# Patient Record
Sex: Female | Born: 1970 | Race: Black or African American | Hispanic: No | Marital: Married
Health system: Southern US, Community
[De-identification: ages and names within clinical notes are randomized; demographics above are authoritative.]

## PROBLEM LIST (undated history)

## (undated) DIAGNOSIS — R0789 Other chest pain: Secondary | ICD-10-CM

## (undated) DIAGNOSIS — M109 Gout, unspecified: Secondary | ICD-10-CM

## (undated) DIAGNOSIS — M545 Low back pain, unspecified: Secondary | ICD-10-CM

## (undated) DIAGNOSIS — I161 Hypertensive emergency: Secondary | ICD-10-CM

## (undated) DIAGNOSIS — E119 Type 2 diabetes mellitus without complications: Secondary | ICD-10-CM

## (undated) DIAGNOSIS — I10 Essential (primary) hypertension: Secondary | ICD-10-CM

## (undated) DIAGNOSIS — J45909 Unspecified asthma, uncomplicated: Secondary | ICD-10-CM

## (undated) DIAGNOSIS — R32 Unspecified urinary incontinence: Secondary | ICD-10-CM

## (undated) DIAGNOSIS — C801 Malignant (primary) neoplasm, unspecified: Secondary | ICD-10-CM

## (undated) DIAGNOSIS — R809 Proteinuria, unspecified: Secondary | ICD-10-CM

## (undated) DIAGNOSIS — G8929 Other chronic pain: Secondary | ICD-10-CM

## (undated) DIAGNOSIS — F341 Dysthymic disorder: Secondary | ICD-10-CM

## (undated) DIAGNOSIS — E785 Hyperlipidemia, unspecified: Secondary | ICD-10-CM

## (undated) DIAGNOSIS — F5103 Paradoxical insomnia: Secondary | ICD-10-CM

## (undated) DIAGNOSIS — F32A Depression, unspecified: Secondary | ICD-10-CM

## (undated) HISTORY — PX: COLONOSCOPY W/ BIOPSIES: SHX1374

## (undated) HISTORY — PX: ESOPHAGOGASTRODUODENOSCOPY: SHX1529

---

## 2006-09-18 ENCOUNTER — Ambulatory Visit: Payer: Self-pay

## 2013-08-05 ENCOUNTER — Encounter: Payer: Self-pay | Admitting: Family Medicine

## 2013-08-26 ENCOUNTER — Encounter: Payer: Self-pay | Admitting: Family Medicine

## 2020-04-04 ENCOUNTER — Ambulatory Visit: Payer: Self-pay | Attending: Internal Medicine

## 2020-04-04 DIAGNOSIS — Z23 Encounter for immunization: Secondary | ICD-10-CM

## 2020-04-04 NOTE — Progress Notes (Signed)
   Covid-19 Vaccination Clinic  Name:  Rebecca Campbell    MRN: [id number] DOB: 15-Apr-1971  04/04/2020  [name] was observed post Covid-19 immunization for 15 minutes without incident. She was provided with Vaccine Information Sheet and instruction to access the V-Safe system.   [name] was instructed to call 911 with any severe reactions post vaccine: Marland Kitchen Difficulty breathing  . Swelling of face and throat  . A fast heartbeat  . A bad rash all over body  . Dizziness and weakness   Immunizations Administered    Name Date Dose VIS Date Route   Pfizer COVID-19 Vaccine 04/04/2020  8:14 AM 0.3 mL 12/06/2019 Intramuscular   Manufacturer: [place]   Lot: [id number]   [place]: [id number]

## 2020-05-02 ENCOUNTER — Ambulatory Visit: Payer: Self-pay | Attending: Internal Medicine

## 2020-05-02 DIAGNOSIS — Z23 Encounter for immunization: Secondary | ICD-10-CM

## 2020-05-02 NOTE — Progress Notes (Signed)
   Covid-19 Vaccination Clinic  Name:  HALLIE ERTL    MRN: [id number] DOB: September 05, 1971  05/02/2020  Ms. Anstey was observed post Covid-19 immunization for 15 minutes without incident. She was provided with Vaccine Information Sheet and instruction to access the V-Safe system.   Ms. Haynes was instructed to call 911 with any severe reactions post vaccine: Marland Kitchen Difficulty breathing  . Swelling of face and throat  . A fast heartbeat  . A bad rash all over body  . Dizziness and weakness   Immunizations Administered    Name Date Dose VIS Date Route   Pfizer COVID-19 Vaccine 05/02/2020  8:23 AM 0.3 mL 02/19/2019 Intramuscular   Manufacturer: [employer]   Lot: [id number]   [place]: [id number]

## 2021-09-12 ENCOUNTER — Encounter: Payer: Self-pay | Admitting: *Deleted

## 2021-09-12 ENCOUNTER — Other Ambulatory Visit: Payer: Self-pay

## 2021-09-12 ENCOUNTER — Inpatient Hospital Stay
Admission: EM | Admit: 2021-09-12 | Discharge: 2021-09-18 | DRG: 291 | Disposition: A | Discharge status: Home Health | Payer: Medicare HMO | Attending: Pulmonary Disease | Admitting: Pulmonary Disease

## 2021-09-12 ENCOUNTER — Emergency Department: Payer: Medicare HMO

## 2021-09-12 DIAGNOSIS — I5033 Acute on chronic diastolic (congestive) heart failure: Secondary | ICD-10-CM

## 2021-09-12 DIAGNOSIS — N179 Acute kidney failure, unspecified: Secondary | ICD-10-CM | POA: Diagnosis present

## 2021-09-12 DIAGNOSIS — Z8616 Personal history of COVID-19: Secondary | ICD-10-CM | POA: Diagnosis not present

## 2021-09-12 DIAGNOSIS — K760 Fatty (change of) liver, not elsewhere classified: Secondary | ICD-10-CM | POA: Diagnosis present

## 2021-09-12 DIAGNOSIS — C3490 Malignant neoplasm of unspecified part of unspecified bronchus or lung: Secondary | ICD-10-CM | POA: Diagnosis present

## 2021-09-12 DIAGNOSIS — J45909 Unspecified asthma, uncomplicated: Secondary | ICD-10-CM | POA: Diagnosis present

## 2021-09-12 DIAGNOSIS — E1122 Type 2 diabetes mellitus with diabetic chronic kidney disease: Secondary | ICD-10-CM | POA: Diagnosis present

## 2021-09-12 DIAGNOSIS — I248 Other forms of acute ischemic heart disease: Secondary | ICD-10-CM | POA: Diagnosis present

## 2021-09-12 DIAGNOSIS — J9600 Acute respiratory failure, unspecified whether with hypoxia or hypercapnia: Secondary | ICD-10-CM | POA: Diagnosis present

## 2021-09-12 DIAGNOSIS — J9602 Acute respiratory failure with hypercapnia: Secondary | ICD-10-CM | POA: Diagnosis not present

## 2021-09-12 DIAGNOSIS — D6959 Other secondary thrombocytopenia: Secondary | ICD-10-CM | POA: Diagnosis present

## 2021-09-12 DIAGNOSIS — F419 Anxiety disorder, unspecified: Secondary | ICD-10-CM

## 2021-09-12 DIAGNOSIS — Z85038 Personal history of other malignant neoplasm of large intestine: Secondary | ICD-10-CM

## 2021-09-12 DIAGNOSIS — C7800 Secondary malignant neoplasm of unspecified lung: Secondary | ICD-10-CM | POA: Diagnosis present

## 2021-09-12 DIAGNOSIS — F341 Dysthymic disorder: Secondary | ICD-10-CM | POA: Diagnosis present

## 2021-09-12 DIAGNOSIS — R0603 Acute respiratory distress: Secondary | ICD-10-CM

## 2021-09-12 DIAGNOSIS — J9622 Acute and chronic respiratory failure with hypercapnia: Secondary | ICD-10-CM | POA: Diagnosis present

## 2021-09-12 DIAGNOSIS — J189 Pneumonia, unspecified organism: Secondary | ICD-10-CM | POA: Diagnosis not present

## 2021-09-12 DIAGNOSIS — F4310 Post-traumatic stress disorder, unspecified: Secondary | ICD-10-CM | POA: Diagnosis present

## 2021-09-12 DIAGNOSIS — Z6841 Body Mass Index (BMI) 40.0 and over, adult: Secondary | ICD-10-CM | POA: Diagnosis not present

## 2021-09-12 DIAGNOSIS — T451X5A Adverse effect of antineoplastic and immunosuppressive drugs, initial encounter: Secondary | ICD-10-CM | POA: Diagnosis present

## 2021-09-12 DIAGNOSIS — I160 Hypertensive urgency: Secondary | ICD-10-CM | POA: Diagnosis present

## 2021-09-12 DIAGNOSIS — J9601 Acute respiratory failure with hypoxia: Secondary | ICD-10-CM | POA: Diagnosis not present

## 2021-09-12 DIAGNOSIS — G4700 Insomnia, unspecified: Secondary | ICD-10-CM | POA: Diagnosis present

## 2021-09-12 DIAGNOSIS — I130 Hypertensive heart and chronic kidney disease with heart failure and stage 1 through stage 4 chronic kidney disease, or unspecified chronic kidney disease: Secondary | ICD-10-CM | POA: Diagnosis present

## 2021-09-12 DIAGNOSIS — Z882 Allergy status to sulfonamides status: Secondary | ICD-10-CM

## 2021-09-12 DIAGNOSIS — I161 Hypertensive emergency: Secondary | ICD-10-CM | POA: Diagnosis present

## 2021-09-12 DIAGNOSIS — N189 Chronic kidney disease, unspecified: Secondary | ICD-10-CM

## 2021-09-12 DIAGNOSIS — T502X5A Adverse effect of carbonic-anhydrase inhibitors, benzothiadiazides and other diuretics, initial encounter: Secondary | ICD-10-CM | POA: Diagnosis present

## 2021-09-12 DIAGNOSIS — N184 Chronic kidney disease, stage 4 (severe): Secondary | ICD-10-CM | POA: Diagnosis present

## 2021-09-12 DIAGNOSIS — J810 Acute pulmonary edema: Secondary | ICD-10-CM

## 2021-09-12 DIAGNOSIS — E785 Hyperlipidemia, unspecified: Secondary | ICD-10-CM | POA: Diagnosis present

## 2021-09-12 DIAGNOSIS — F32A Depression, unspecified: Secondary | ICD-10-CM

## 2021-09-12 DIAGNOSIS — Z888 Allergy status to other drugs, medicaments and biological substances status: Secondary | ICD-10-CM

## 2021-09-12 DIAGNOSIS — J9621 Acute and chronic respiratory failure with hypoxia: Secondary | ICD-10-CM | POA: Diagnosis present

## 2021-09-12 DIAGNOSIS — D6481 Anemia due to antineoplastic chemotherapy: Secondary | ICD-10-CM | POA: Diagnosis present

## 2021-09-12 DIAGNOSIS — Z881 Allergy status to other antibiotic agents status: Secondary | ICD-10-CM

## 2021-09-12 DIAGNOSIS — C19 Malignant neoplasm of rectosigmoid junction: Secondary | ICD-10-CM | POA: Diagnosis present

## 2021-09-12 DIAGNOSIS — G8929 Other chronic pain: Secondary | ICD-10-CM

## 2021-09-12 DIAGNOSIS — M109 Gout, unspecified: Secondary | ICD-10-CM | POA: Diagnosis present

## 2021-09-12 DIAGNOSIS — J984 Other disorders of lung: Secondary | ICD-10-CM | POA: Diagnosis present

## 2021-09-12 DIAGNOSIS — E662 Morbid (severe) obesity with alveolar hypoventilation: Secondary | ICD-10-CM | POA: Diagnosis present

## 2021-09-12 DIAGNOSIS — E870 Hyperosmolality and hypernatremia: Secondary | ICD-10-CM | POA: Diagnosis present

## 2021-09-12 DIAGNOSIS — Z794 Long term (current) use of insulin: Secondary | ICD-10-CM

## 2021-09-12 DIAGNOSIS — J9620 Acute and chronic respiratory failure, unspecified whether with hypoxia or hypercapnia: Secondary | ICD-10-CM

## 2021-09-12 DIAGNOSIS — E1169 Type 2 diabetes mellitus with other specified complication: Secondary | ICD-10-CM

## 2021-09-12 DIAGNOSIS — R32 Unspecified urinary incontinence: Secondary | ICD-10-CM | POA: Diagnosis present

## 2021-09-12 DIAGNOSIS — I2510 Atherosclerotic heart disease of native coronary artery without angina pectoris: Secondary | ICD-10-CM | POA: Diagnosis present

## 2021-09-12 HISTORY — DX: Other chronic pain: G89.29

## 2021-09-12 HISTORY — DX: Unspecified asthma, uncomplicated: J45.909

## 2021-09-12 HISTORY — DX: Other chest pain: R07.89

## 2021-09-12 HISTORY — DX: Hypertensive emergency: I16.1

## 2021-09-12 HISTORY — DX: Proteinuria, unspecified: R80.9

## 2021-09-12 HISTORY — DX: Depression, unspecified: F32.A

## 2021-09-12 HISTORY — DX: Paradoxical insomnia: F51.03

## 2021-09-12 HISTORY — DX: Hyperlipidemia, unspecified: E78.5

## 2021-09-12 HISTORY — DX: Morbid (severe) obesity due to excess calories: E66.01

## 2021-09-12 HISTORY — DX: Malignant (primary) neoplasm, unspecified: C80.1

## 2021-09-12 HISTORY — DX: Type 2 diabetes mellitus without complications: E11.9

## 2021-09-12 HISTORY — DX: Low back pain, unspecified: M54.50

## 2021-09-12 HISTORY — DX: Gout, unspecified: M10.9

## 2021-09-12 HISTORY — DX: Dysthymic disorder: F34.1

## 2021-09-12 HISTORY — DX: Unspecified urinary incontinence: R32

## 2021-09-12 HISTORY — DX: Essential (primary) hypertension: I10

## 2021-09-12 LAB — CBC WITH DIFFERENTIAL/PLATELET
Abs Immature Granulocytes: 0.05 10*3/uL (ref 0.00–0.07)
Basophils Absolute: 0 10*3/uL (ref 0.0–0.1)
Basophils Relative: 0 %
Eosinophils Absolute: 0.1 10*3/uL (ref 0.0–0.5)
Eosinophils Relative: 1 %
HCT: 33 % — ABNORMAL LOW (ref 36.0–46.0)
Hemoglobin: 10.8 g/dL — ABNORMAL LOW (ref 12.0–15.0)
Immature Granulocytes: 0 %
Lymphocytes Relative: 20 %
Lymphs Abs: 3 10*3/uL (ref 0.7–4.0)
MCH: 30.6 pg (ref 26.0–34.0)
MCHC: 32.7 g/dL (ref 30.0–36.0)
MCV: 93.5 fL (ref 80.0–100.0)
Monocytes Absolute: 0.5 10*3/uL (ref 0.1–1.0)
Monocytes Relative: 3 %
Neutro Abs: 11 10*3/uL — ABNORMAL HIGH (ref 1.7–7.7)
Neutrophils Relative %: 76 %
Platelets: 174 10*3/uL (ref 150–400)
RBC: 3.53 MIL/uL — ABNORMAL LOW (ref 3.87–5.11)
RDW: 15.9 % — ABNORMAL HIGH (ref 11.5–15.5)
WBC: 14.6 10*3/uL — ABNORMAL HIGH (ref 4.0–10.5)
nRBC: 0.1 % (ref 0.0–0.2)

## 2021-09-12 LAB — COMPREHENSIVE METABOLIC PANEL
ALT: 18 U/L (ref 0–44)
AST: 19 U/L (ref 15–41)
Albumin: 3.5 g/dL (ref 3.5–5.0)
Alkaline Phosphatase: 84 U/L (ref 38–126)
Anion gap: 6 (ref 5–15)
BUN: 46 mg/dL — ABNORMAL HIGH (ref 6–20)
CO2: 29 mmol/L (ref 22–32)
Calcium: 8.8 mg/dL — ABNORMAL LOW (ref 8.9–10.3)
Chloride: 109 mmol/L (ref 98–111)
Creatinine, Ser: 2.35 mg/dL — ABNORMAL HIGH (ref 0.44–1.00)
GFR, Estimated: 24 mL/min — ABNORMAL LOW (ref >60)
Glucose, Bld: 245 mg/dL — ABNORMAL HIGH (ref 70–99)
Potassium: 4 mmol/L (ref 3.5–5.1)
Sodium: 144 mmol/L (ref 135–145)
Total Bilirubin: 0.8 mg/dL (ref 0.3–1.2)
Total Protein: 7.2 g/dL (ref 6.5–8.1)

## 2021-09-12 LAB — RESP PANEL BY RT-PCR (FLU A&B, COVID) ARPGX2
Influenza A by PCR: NEGATIVE
Influenza B by PCR: NEGATIVE
SARS Coronavirus 2 by RT PCR: NEGATIVE

## 2021-09-12 LAB — URINALYSIS, COMPLETE (UACMP) WITH MICROSCOPIC
Bacteria, UA: NONE SEEN
Bilirubin Urine: NEGATIVE
Glucose, UA: 500 mg/dL — AB
Ketones, ur: NEGATIVE mg/dL
Leukocytes,Ua: NEGATIVE
Nitrite: NEGATIVE
Protein, ur: >300 mg/dL — AB
Specific Gravity, Urine: 1.02 (ref 1.005–1.030)
pH: 5.5 (ref 5.0–8.0)

## 2021-09-12 LAB — BLOOD GAS, ARTERIAL
Acid-base deficit: 0.5 mmol/L (ref 0.0–2.0)
Bicarbonate: 26.8 mmol/L (ref 20.0–28.0)
Expiratory PAP: 8
FIO2: 100
Inspiratory PAP: 16
O2 Saturation: 90.7 %
Patient temperature: 37
pCO2 arterial: 57 mmHg — ABNORMAL HIGH (ref 32.0–48.0)
pH, Arterial: 7.28 — ABNORMAL LOW (ref 7.350–7.450)
pO2, Arterial: 68 mmHg — ABNORMAL LOW (ref 83.0–108.0)

## 2021-09-12 LAB — PROTIME-INR
INR: 1.1 (ref 0.8–1.2)
Prothrombin Time: 14.3 seconds (ref 11.4–15.2)

## 2021-09-12 LAB — LACTIC ACID, PLASMA: Lactic Acid, Venous: 1.6 mmol/L (ref 0.5–1.9)

## 2021-09-12 LAB — APTT: aPTT: 27 seconds (ref 24–36)

## 2021-09-12 LAB — PROCALCITONIN: Procalcitonin: <0.1 ng/mL

## 2021-09-12 LAB — BRAIN NATRIURETIC PEPTIDE: B Natriuretic Peptide: 354.6 pg/mL — ABNORMAL HIGH (ref 0.0–100.0)

## 2021-09-12 LAB — TROPONIN I (HIGH SENSITIVITY): Troponin I (High Sensitivity): 40 ng/L — ABNORMAL HIGH (ref <18)

## 2021-09-12 MED ORDER — PROPOFOL 1000 MG/100ML IV EMUL
INTRAVENOUS | Status: AC
  Filled 2021-09-12: qty 100

## 2021-09-12 MED ORDER — ROCURONIUM BROMIDE 10 MG/ML (PF) SYRINGE
PREFILLED_SYRINGE | INTRAVENOUS | Status: AC
  Filled 2021-09-12: qty 10

## 2021-09-12 MED ORDER — HYDRALAZINE HCL 20 MG/ML IJ SOLN
10.0000 mg | INTRAMUSCULAR | Status: DC | PRN
  Administered 2021-09-13 (×3): 20 mg via INTRAVENOUS
  Filled 2021-09-12 (×3): qty 1

## 2021-09-12 MED ORDER — HEPARIN SODIUM (PORCINE) 5000 UNIT/ML IJ SOLN
5000.0000 [IU] | Freq: Three times a day (TID) | INTRAMUSCULAR | Status: DC
  Administered 2021-09-12 – 2021-09-18 (×17): 5000 [IU] via SUBCUTANEOUS
  Filled 2021-09-12 (×17): qty 1

## 2021-09-12 MED ORDER — IPRATROPIUM-ALBUTEROL 0.5-2.5 (3) MG/3ML IN SOLN
3.0000 mL | RESPIRATORY_TRACT | Status: DC | PRN
  Administered 2021-09-17: 3 mL via RESPIRATORY_TRACT
  Filled 2021-09-12: qty 3

## 2021-09-12 MED ORDER — METHYLPREDNISOLONE SODIUM SUCC 125 MG IJ SOLR
125.0000 mg | Freq: Once | INTRAMUSCULAR | Status: AC
  Administered 2021-09-12: 125 mg via INTRAVENOUS
  Filled 2021-09-12: qty 2

## 2021-09-12 MED ORDER — NITROGLYCERIN IN D5W 200-5 MCG/ML-% IV SOLN
INTRAVENOUS | Status: AC
  Administered 2021-09-12: 50 ug/min via INTRAVENOUS
  Filled 2021-09-12: qty 250

## 2021-09-12 MED ORDER — POLYETHYLENE GLYCOL 3350 17 G PO PACK: 17.0000 g | PACK | Freq: Every day | ORAL | Status: DC | PRN

## 2021-09-12 MED ORDER — DOCUSATE SODIUM 100 MG PO CAPS: 100.0000 mg | ORAL_CAPSULE | Freq: Two times a day (BID) | ORAL | Status: DC | PRN

## 2021-09-12 MED ORDER — FUROSEMIDE 10 MG/ML IJ SOLN
80.0000 mg | Freq: Once | INTRAMUSCULAR | Status: AC
  Administered 2021-09-12: 80 mg via INTRAVENOUS
  Filled 2021-09-12: qty 8

## 2021-09-12 MED ORDER — IPRATROPIUM-ALBUTEROL 0.5-2.5 (3) MG/3ML IN SOLN: 3.0000 mL | Freq: Once | RESPIRATORY_TRACT | Status: DC

## 2021-09-12 MED ORDER — NITROGLYCERIN IN D5W 200-5 MCG/ML-% IV SOLN: 0.0000 ug/min | INTRAVENOUS | Status: DC

## 2021-09-12 MED ORDER — KETAMINE HCL 50 MG/5ML IJ SOSY
PREFILLED_SYRINGE | INTRAMUSCULAR | Status: AC
  Filled 2021-09-12: qty 5

## 2021-09-12 MED ORDER — SODIUM CHLORIDE 0.9 % IV SOLN: 500.0000 mg | INTRAVENOUS | Status: DC

## 2021-09-12 MED ORDER — MORPHINE SULFATE (PF) 4 MG/ML IV SOLN
4.0000 mg | Freq: Once | INTRAVENOUS | Status: DC
  Administered 2021-09-12: 4 mg via INTRAVENOUS
  Filled 2021-09-12: qty 1

## 2021-09-12 MED ORDER — INSULIN ASPART 100 UNIT/ML IJ SOLN
0.0000 [IU] | INTRAMUSCULAR | Status: DC
Start: 2021-09-13 — End: 2021-09-13
  Administered 2021-09-13 (×2): 5 [IU] via SUBCUTANEOUS
  Administered 2021-09-13 (×2): 3 [IU] via SUBCUTANEOUS
  Filled 2021-09-12 (×4): qty 1

## 2021-09-12 MED ORDER — HYDROMORPHONE HCL 1 MG/ML IJ SOLN
0.5000 mg | Freq: Once | INTRAMUSCULAR | Status: AC
  Administered 2021-09-12: 0.5 mg via INTRAVENOUS
  Filled 2021-09-12: qty 1

## 2021-09-12 NOTE — ED Triage Notes (Signed)
Pt brought in by ems for c/o respiratory distress; pt sats in the 40's at home; pt unable to tolerate bipap with ems due to anxiety; pt arrived unable to verbalize complaint; pt placed on bipap and pt is now alert and able to talk

## 2021-09-12 NOTE — ED Notes (Signed)
Pt c/o being wet; purewick catheter applied and chuck pad placed under pt; husband at bedside and states he is not happy because she is here; explained that ems has to take pt to nearest facility based on pt's acuity; Dr. Tamala Julian informed of spouses' need to speak with him

## 2021-09-12 NOTE — Progress Notes (Signed)
PHARMACY CONSULT NOTE - FOLLOW UP  Pharmacy Consult for Electrolyte Monitoring and Replacement   Recent Labs: Potassium (mmol/L)  Date Value  09/12/2021 4.0   Calcium (mg/dL)  Date Value  09/12/2021 8.8 (L)   Albumin (g/dL)  Date Value  09/12/2021 3.5   Sodium (mmol/L)  Date Value  09/12/2021 144     Assessment: 9/18:  Ca = 8.8,   Albumin = 3.5,  Corrected Ca = 9.2  Goal of Therapy:  Electrolytes WNL   Plan:  No additional electrolytes needed at this time.  Will recheck electrolytes on 9/19 with AM labs.   Orene Desanctis ,PharmD Clinical Pharmacist 09/12/2021 11:05 PM

## 2021-09-12 NOTE — ED Provider Notes (Signed)
[facility] Emergency Department Provider Note ____________________________________________   Event Date/Time   First MD Initiated Contact with Patient 09/12/21 1916     (approximate)  I have reviewed the triage vital signs and the nursing notes.  HISTORY  Chief Complaint Respiratory Distress   HPI Rebecca Campbell is a 50 y.o. femalewho presents to the ED for evaluation of respiratory distress.   Chart review indicates history of colon cancer metastasized to the lung receiving majority of care at [facility].  COVID-19 last month.  Morbid obesity, HTN DM and CHF.  Chronic respiratory failure on 4-5 L home O2.  Patient presents to the ED via EMS from home for evaluation of rapidly progressive shortness of breath and respiratory distress.  She presents tripoding, guppy breathing and in extremis being bagged by EMS.  First sat, with a good waveform, on arrival to the ED is 18%.  History limited by acuity.  Husband later arrives and indicates that she has become increasingly short of breath over the past couple days, and so would sit up on the side of the bed with some improvement.  Past Medical History:  Diagnosis Date   Albuminuria    Asthma    Atypical chest pain    Cancer (HCC)    Chronic pain    Depression    Diabetes mellitus without complication ([place])    Dysthymic disorder    Gouty arthropathy    Hyperlipidemia    Hypertension    Hypertensive emergency    Hyposomnia, insomnia, or sleeplessness associated with conditioned arousal    Lumbago    Morbid obesity ([place])    Urinary incontinence     Patient Active Problem List   Diagnosis Date Noted   Acute respiratory failure ([place]) 09/12/2021    Past Surgical History:  Procedure Laterality Date   COLONOSCOPY W/ BIOPSIES     ESOPHAGOGASTRODUODENOSCOPY      Prior to Admission medications   Not on File    Allergies Keflex [cephalexin], Nafcillin sodium in dextrose, and Sulfa  antibiotics  History reviewed. No pertinent family history.  Social History Social History   Tobacco Use   Smoking status: Unknown  Substance Use Topics   Alcohol use: Not Currently   Drug use: Not Currently    Review of Systems  Unable to be accurately assessed due to patient's respiratory distress and acuity of condition. ____________________________________________   PHYSICAL EXAM:  VITAL SIGNS: Vitals:   09/12/21 2030 09/12/21 2045  BP: (!) 170/99 (!) 178/104  Pulse: (!) 117 (!) 116  Resp: (!) 38 (!) 39  SpO2: 98% 97%     Constitutional: Morbidly obese and somnolent on arrival. Upon reassessments after BiPAP, she is awake, alert and follows commands in all 4 extremities.  Interacting appropriately. Eyes: Conjunctivae are normal. PERRL. EOMI. Head: Atraumatic. Nose: No congestion/rhinnorhea. Mouth/Throat: Mucous membranes are moist.  Oropharynx non-erythematous. Neck: No stridor. No cervical spine tenderness to palpation. Cardiovascular: Tachycardic rate, regular rhythm. Grossly normal heart sounds.  Good peripheral circulation. Respiratory: Tachypneic to about 40.  Rhonchorous breath sounds throughout. Gastrointestinal: Soft , nondistended, nontender to palpation. No CVA tenderness. Musculoskeletal:  No joint effusions. No signs of acute trauma. Port to right-sided chest without induration or fluctuance. Trace pitting edema to bilateral lower extremities. Neurologic:  Normal speech and language. No gross focal neurologic deficits are appreciated. No gait instability noted. Skin:  Skin is warm, dry and intact. No rash noted. Psychiatric: Mood and affect are normal. Speech and behavior are  normal.  ____________________________________________   LABS (all labs ordered are listed, but only abnormal results are displayed)  Labs Reviewed  COMPREHENSIVE METABOLIC PANEL - Abnormal; Notable for the following components:      Result Value   Glucose, Bld 245 (*)     BUN 46 (*)    Creatinine, Ser 2.35 (*)    Calcium 8.8 (*)    GFR, Estimated 24 (*)    All other components within normal limits  CBC WITH DIFFERENTIAL/PLATELET - Abnormal; Notable for the following components:   WBC 14.6 (*)    RBC 3.53 (*)    Hemoglobin 10.8 (*)    HCT 33.0 (*)    RDW 15.9 (*)    Neutro Abs 11.0 (*)    All other components within normal limits  BRAIN NATRIURETIC PEPTIDE - Abnormal; Notable for the following components:   B Natriuretic Peptide 354.6 (*)    All other components within normal limits  BLOOD GAS, ARTERIAL - Abnormal; Notable for the following components:   pH, Arterial 7.28 (*)    pCO2 arterial 57 (*)    pO2, Arterial 68 (*)    All other components within normal limits  TROPONIN I (HIGH SENSITIVITY) - Abnormal; Notable for the following components:   Troponin I (High Sensitivity) 40 (*)    All other components within normal limits  RESP PANEL BY RT-PCR (FLU A&B, COVID) ARPGX2  CULTURE, BLOOD (SINGLE)  PROTIME-INR  APTT  LACTIC ACID, PLASMA  URINALYSIS, COMPLETE (UACMP) WITH MICROSCOPIC  LACTIC ACID, PLASMA  PROCALCITONIN  PROCALCITONIN  HIV ANTIBODY (ROUTINE TESTING W REFLEX)  CBC  BASIC METABOLIC PANEL  MAGNESIUM  PHOSPHORUS  HEMOGLOBIN A1C  TROPONIN I (HIGH SENSITIVITY)   ____________________________________________  12 Lead EKG  Sinus rhythm with a rate of 107 bpm.  Normal axis.  QTC appears a little longer, no STEMI. ____________________________________________  RADIOLOGY  ED MD interpretation: 1 view CXR reviewed by me with pulmonary vascular congestion without clear infiltration or PTX  Official radiology report(s): DG Chest Portable 1 View  Result Date: 09/12/2021 CLINICAL DATA:  Respiratory distress. EXAM: PORTABLE CHEST 1 VIEW COMPARISON:  None. FINDINGS: Shallow inspiration and kyphotic positioning limit examination. Cardiac enlargement. Suggestion of hazy perihilar opacities, possibly edema or pneumonia. No pleural  effusions. Right central venous catheter with tip over the cavoatrial junction region. IMPRESSION: Technically limited study with shallow inspiration. Cardiac enlargement with probable perihilar edema. Electronically Signed   By: Lucienne Capers M.D.   On: 09/12/2021 20:07    ____________________________________________   PROCEDURES and INTERVENTIONS  Procedure(s) performed (including Critical Care):  .1-3 Lead EKG Interpretation Performed by: Vladimir Crofts, MD Authorized by: Vladimir Crofts, MD     Interpretation: abnormal     ECG rate:  120   ECG rate assessment: tachycardic     Rhythm: sinus tachycardia     Ectopy: none     Conduction: normal   .Critical Care Performed by: Vladimir Crofts, MD Authorized by: Vladimir Crofts, MD   Critical care provider statement:    Critical care time (minutes):  45   Critical care was necessary to treat or prevent imminent or life-threatening deterioration of the following conditions:  Respiratory failure   Critical care was time spent personally by me on the following activities:  Discussions with consultants, evaluation of patient's response to treatment, examination of patient, ordering and performing treatments and interventions, ordering and review of laboratory studies, ordering and review of radiographic studies, pulse oximetry, re-evaluation of patient's condition, obtaining history  from patient or surrogate and review of old charts  Medications  nitroGLYCERIN 50 mg in dextrose 5 % 250 mL (0.2 mg/mL) infusion (50 mcg/min Intravenous New Bag/Given 09/12/21 2100)  docusate sodium (COLACE) capsule 100 mg (has no administration in time range)  polyethylene glycol (MIRALAX / GLYCOLAX) packet 17 g (has no administration in time range)  heparin injection 5,000 Units (has no administration in time range)  insulin aspart (novoLOG) injection 0-15 Units (has no administration in time range)  ipratropium-albuterol (DUONEB) 0.5-2.5 (3) MG/3ML nebulizer solution  3 mL (has no administration in time range)  methylPREDNISolone sodium succinate (SOLU-MEDROL) 125 mg/2 mL injection 125 mg (has no administration in time range)  furosemide (LASIX) injection 80 mg (80 mg Intravenous Given 09/12/21 2101)    ____________________________________________   MDM / ED COURSE   50 year old woman presents to the ED in respiratory distress, most likely due to acute pulmonary edema and CHF exacerbation, turning around quite nicely in the ED with BiPAP and requiring ICU admission.  She presents with first O2 sat of 18%, hypertensive and tachycardic in a sinus tach.  Clinical presentation is most consistent with acute pulmonary edema.  Some mild wheezing is noted as well as primary respiratory acidosis, so we will provide steroids and inline duo nebs as well.  Creatinine of 2.35 worse than baseline, which appears to be around 2.0, could be related to cardiorenal syndrome.  Procalcitonin pending at the time of admission to ICU for evaluation of need for antibiotics.  No current indications of infectious pathology.  Marginal elevation of troponins likely due to her respiratory status.  No evidence of STEMI.  Clinical Course as of 09/12/21 2143  Nancy Fetter Sep 12, 2021  1918 Talking to husband [DS]  1935 About to intubate when she looks quite a bit better.  We will hold off on intubation and pursue BiPAP, nitroglycerin drip. [DS]  1950 Reassessed. Doing well, pressures improving with ntg [DS]  2028 Reassessed.  Husband at the bedside.  He expresses some dissatisfaction that the patient is not currently at [facility].  We discussed her rather extreme presentation and no indication to transfer at this point.  We discussed plan of care and he is in agreement. [DS]  2112 I discuss with the ICU, who will admit [DS]  2126 Reassessed.  Continue to improve heart rate and respiratory rate is slowing. [DS]    Clinical Course User Index [DS] Vladimir Crofts, MD     ____________________________________________   FINAL CLINICAL IMPRESSION(S) / ED DIAGNOSES  Final diagnoses:  Respiratory distress  Acute pulmonary edema [facility])     ED Discharge Orders     None        Crawford Tamura   Note:  This document was prepared using Dragon voice recognition software and may include unintentional dictation errors.    Vladimir Crofts, MD 09/12/21 [phone]

## 2021-09-12 NOTE — H&P (Addendum)
NAME:  Rebecca Campbell, MRN:  [id number], DOB:  20-Apr-1971, LOS: 0 ADMISSION DATE:  09/12/2021, CONSULTATION DATE: 09/12/2021 REFERRING MD: Dr. Charlsie Quest, CHIEF COMPLAINT:   Respiratory distress  History of Present Illness:  50 yo F presented at [facility] ED on 09/12/21 from home with husband due to worsening shortness of breath over the past 2-3 days.  Of note patient restarted chemotherapy treatment 2 days ago on Friday, 09/10/2021, after a hiatus from treatment due to COVID-19 infection and hospitalization in August 2022.  Patient and her husband who is bedside deny any recent sick contacts, fever/chills, GI symptoms out of the ordinary intermittent diarrhea.  Patient does endorse a chronic cough with some worsening darker sputum over the last 2 days.  She also admits to pain across her chest down her sides and her back that she reports is because of her "hard breathing".  ED course: Upon arrival patient was in respiratory distress, per ED documentation: Tripoding /guppy breathing in an extremis being bagged by EMS.  First SPO2 reading with a good waveform was 18%.  Patient was placed on BiPAP at 100% FiO2, received 80 mg Lasix, 125 mg Solu-Medrol & Duo-Neb.  She was also tachycardic and hypertensive, Nitro drip started.  Patient stabilized and PCCM was consulted for admission to ICU due to high risk for intubation.  Initial vitals: Afebrile at 98.1, tachypneic at 41, tachycardic at 129, hypertensive at 185/107 & SpO2 94% on BIPAP at 100% fio2. Significant labs: (Labs/ Imaging personally reviewed) Na+/ K+: 144/ 4.0, BUN/Cr.: 46/ 2.35, Serum CO2/ AG: 29/ 6 Hgb: 10.8, WBC: 14.6, Lactic/ PCT: 1.6/ < 0.10 Troponin: 40 BNP: 354.6  ABG: 7.28/ 57/ 68/ 26.8 CXR 09/12/21: shallow inspiration and kyphotic positioning limit examination. Probable perihilar edema vs pneumonia. Pertinent  Medical History  Colon Cancer with lung mets (4-5 L [place] chronic O2) T2DM CAD HTN HLD Morbid Obesity Asthma CKD stage  4 HFpEF PTSD Fatty liver Gout COVID-19 (08/02/2021) Significant Hospital Events: Including procedures, antibiotic start and stop dates in addition to other pertinent events   09/12/21: Patient admitted in respiratory distress requiring BIPAP support  Interim History / Subjective:  Patient on BIPAP support appearing comfortable, states her breathing feels much improved.  Husband is bedside, all questions and concerns answered at this time.  Objective   Blood pressure (!) 178/104, pulse (!) 116, resp. rate (!) 39, SpO2 97 %.       No intake or output data in the 24 hours ending 09/12/21 2131 There were no vitals filed for this visit.  Examination: General: Adult female, critically ill, lying in bed- generalized weakness, NAD HEENT: MM pink/moist, anicteric, atraumatic, neck supple Neuro: A&O x 4, able to follow commands, PERRL +3, MAE CV: s1s2 RRR, ST on monitor, no r/m/g Pulm: Regular, non labored on BIPAP at 100% fio2 , breath sounds crackles throughout GI: soft, obese, umbilical hernia present, non tender, bs x 4 GU: pure wick in place with clear yellow urine Skin: limited exam, no rashes/lesions noted Extremities: warm/dry, pulses + 2 R/P, +1 edema noted BLE  Resolved Hospital Problem list     Assessment & Plan:  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 secondary to pulmonary edema in the setting of restrictive lung disease Suspected CAP in an immunocompromised patient PMHx: colon cancer with lung mets, kyphosis, COVID-19, asthma, chronic O2 use - Continue BIPAP overnight, wean FiO2 as tolerated - Supplemental O2 to maintain SpO2 > 90% - Intermittent chest x-ray & ABG  PRN - Ensure adequate pulmonary hygiene  - F/u cultures, trend PCT - Continue CAP coverage: meropenem (patient allergic to cephalosporins) & vancomycin - bronchodilators PRN  Acute on Chronic HFpEF exacerbation Elevated troponin secondary to suspected demand ischemia HTN PMHx:  HFpEF, HTN, HLD Troponin: 40, BNP: 354.6 - Echocardiogram ordered - hydralazine & labetalol IVP PRN's ordered, transition off nitroglycerin drip as tolerated. Goal SBP < 160 - PO outpatient medication on hold while on continuous BIPAP support, consider restarting as patient stabilizes: ASA, atorvastatin, clonidine, losartan, carvedilol, Norvasc, spironolactone  - Continuous cardiac monitoring  - Daily weights to assess volume status - Continue diuresis, 40 mg BID IV lasix x 2 doses- reassess daily   Acute Kidney Injury superimposed on CKD Stage 3b Query cardiorenal syndrome Baseline Cr: 2.0, Cr on admission: 52.35 - Strict I/O's: alert provider if UOP < 0.5 mL/kg/hr - Daily BMP, replace electrolytes PRN - Avoid nephrotoxic agents as able, ensure adequate renal perfusion  Chronic Pain in the setting of Kyphosis PMHx: Anxiety & Depression, lumbago Patient c/o 9/10 back pain - dilaudid 0.5 mg ordered, will see how pt tolerates, consider additional dosing PRN - continue outpatient lorazepam 0.5 PRN - restart home medications once off continuous BIPAP support: Lexapro, Zyprexa, oxycodone, flexeril, methocarbamol, Cymbalta, Lyrica, tramadol - Q 2 repositioning & turns  Type 2 Diabetes Mellitus Hemoglobin A1C: pending - Monitor CBG Q 4 hours - SSI moderate dosing - target range while in ICU: 140-180 - follow ICU hyper/hypo-glycemia protocol  Colon cancer with metastasis to the lung Last dose Friday 09/10/21 - supportive care - chemo precautions  Best Practice (right click and "Reselect all SmartList Selections" daily)  Diet/type: NPO DVT prophylaxis: prophylactic heparin  GI prophylaxis: PPI Lines: N/A Foley:  N/A Code Status:  full code Last date of multidisciplinary goals of care discussion [09/12/21]  Labs   CBC: Recent Labs  Lab 09/12/21 1936  WBC 14.6*  NEUTROABS 11.0*  HGB 10.8*  HCT 33.0*  MCV 93.5  PLT 300    Basic Metabolic Panel: Recent Labs  Lab  09/12/21 1936  NA 144  K 4.0  CL 109  CO2 29  GLUCOSE 245*  BUN 46*  CREATININE 2.35*  CALCIUM 8.8*   GFR: CrCl cannot be calculated (Unknown ideal weight.). Recent Labs  Lab 09/12/21 1936 09/12/21 1937  WBC 14.6*  --   LATICACIDVEN  --  1.6    Liver Function Tests: Recent Labs  Lab 09/12/21 1936  AST 19  ALT 18  ALKPHOS 84  BILITOT 0.8  PROT 7.2  ALBUMIN 3.5   No results for input(s): LIPASE, AMYLASE in the last 168 hours. No results for input(s): AMMONIA in the last 168 hours.  ABG    Component Value Date/Time   PHART 7.28 (L) 09/12/2021 1936   PCO2ART 57 (H) 09/12/2021 1936   PO2ART 68 (L) 09/12/2021 1936   HCO3 26.8 09/12/2021 1936   ACIDBASEDEF 0.5 09/12/2021 1936   O2SAT 90.7 09/12/2021 1936     Coagulation Profile: Recent Labs  Lab 09/12/21 1936  INR 1.1    Cardiac Enzymes: No results for input(s): CKTOTAL, CKMB, CKMBINDEX, TROPONINI in the last 168 hours.  HbA1C: No results found for: HGBA1C  CBG: No results for input(s): GLUCAP in the last 168 hours.  Review of Systems: Positives in BOLD  Gen: Denies fever, chills, weight change, fatigue, night sweats, back & flank pain HEENT: Denies blurred vision, double vision, hearing loss, tinnitus, sinus congestion, rhinorrhea, sore throat, neck stiffness, dysphagia  PULM: Denies shortness of breath, cough, sputum production, hemoptysis, wheezing CV: Denies chest pain, edema, orthopnea, paroxysmal nocturnal dyspnea, palpitations GI: Denies abdominal pain, nausea, vomiting, diarrhea, hematochezia, melena, constipation, change in bowel habits GU: Denies dysuria, hematuria, polyuria, oliguria, urethral discharge Endocrine: Denies hot or cold intolerance, polyuria, polyphagia or appetite change Derm: Denies rash, dry skin, scaling or peeling skin change Heme: Denies easy bruising, bleeding, bleeding gums Neuro: Denies headache, numbness, weakness, slurred speech, loss of memory or  consciousness   Past Medical History:  She,  has a past medical history of Albuminuria, Asthma, Atypical chest pain, Cancer ([place]), Chronic pain, Depression, Diabetes mellitus without complication ([place]), Dysthymic disorder, Gouty arthropathy, Hyperlipidemia, Hypertension, Hypertensive emergency, Hyposomnia, insomnia, or sleeplessness associated with conditioned arousal, Lumbago, Morbid obesity ([place]), and Urinary incontinence.   Surgical History:   Past Surgical History:  Procedure Laterality Date   COLONOSCOPY W/ BIOPSIES     ESOPHAGOGASTRODUODENOSCOPY       Social History:   reports that she does not currently use alcohol. She reports that she does not currently use drugs.   Family History:  Her family history is not on file.   Allergies Allergies  Allergen Reactions   Keflex [Cephalexin]    Nafcillin Sodium In Dextrose    Sulfa Antibiotics      Home Medications  Prior to Admission medications   Not on File     Critical care time: 65 minutes       Venetia Night, AGACNP-BC Acute Care Nurse Practitioner [place]   [phone] / [phone] Please see Amion for pager details.

## 2021-09-13 ENCOUNTER — Inpatient Hospital Stay: Payer: Medicare HMO

## 2021-09-13 DIAGNOSIS — N189 Chronic kidney disease, unspecified: Secondary | ICD-10-CM

## 2021-09-13 DIAGNOSIS — F32A Depression, unspecified: Secondary | ICD-10-CM

## 2021-09-13 DIAGNOSIS — I5033 Acute on chronic diastolic (congestive) heart failure: Secondary | ICD-10-CM

## 2021-09-13 DIAGNOSIS — N179 Acute kidney failure, unspecified: Secondary | ICD-10-CM

## 2021-09-13 DIAGNOSIS — J9601 Acute respiratory failure with hypoxia: Secondary | ICD-10-CM

## 2021-09-13 DIAGNOSIS — F419 Anxiety disorder, unspecified: Secondary | ICD-10-CM

## 2021-09-13 DIAGNOSIS — G8929 Other chronic pain: Secondary | ICD-10-CM

## 2021-09-13 LAB — GLUCOSE, CAPILLARY
Glucose-Capillary: 184 mg/dL — ABNORMAL HIGH (ref 70–99)
Glucose-Capillary: 183 mg/dL — ABNORMAL HIGH (ref 70–99)
Glucose-Capillary: 201 mg/dL — ABNORMAL HIGH (ref 70–99)
Glucose-Capillary: 237 mg/dL — ABNORMAL HIGH (ref 70–99)
Glucose-Capillary: 251 mg/dL — ABNORMAL HIGH (ref 70–99)

## 2021-09-13 LAB — MRSA NEXT GEN BY PCR, NASAL: MRSA by PCR Next Gen: NOT DETECTED

## 2021-09-13 LAB — CBC
HCT: 32.6 % — ABNORMAL LOW (ref 36.0–46.0)
Hemoglobin: 10.3 g/dL — ABNORMAL LOW (ref 12.0–15.0)
MCH: 29.3 pg (ref 26.0–34.0)
MCHC: 31.6 g/dL (ref 30.0–36.0)
MCV: 92.6 fL (ref 80.0–100.0)
Platelets: 139 10*3/uL — ABNORMAL LOW (ref 150–400)
RBC: 3.52 MIL/uL — ABNORMAL LOW (ref 3.87–5.11)
RDW: 15.9 % — ABNORMAL HIGH (ref 11.5–15.5)
WBC: 9.8 10*3/uL (ref 4.0–10.5)
nRBC: 0.2 % (ref 0.0–0.2)

## 2021-09-13 LAB — CBG MONITORING, ED: Glucose-Capillary: 201 mg/dL — ABNORMAL HIGH (ref 70–99)

## 2021-09-13 LAB — BASIC METABOLIC PANEL
Anion gap: 10 (ref 5–15)
BUN: 50 mg/dL — ABNORMAL HIGH (ref 6–20)
CO2: 26 mmol/L (ref 22–32)
Calcium: 8.9 mg/dL (ref 8.9–10.3)
Chloride: 110 mmol/L (ref 98–111)
Creatinine, Ser: 2.21 mg/dL — ABNORMAL HIGH (ref 0.44–1.00)
GFR, Estimated: 26 mL/min — ABNORMAL LOW (ref >60)
Glucose, Bld: 189 mg/dL — ABNORMAL HIGH (ref 70–99)
Potassium: 4.2 mmol/L (ref 3.5–5.1)
Sodium: 146 mmol/L — ABNORMAL HIGH (ref 135–145)

## 2021-09-13 LAB — TROPONIN I (HIGH SENSITIVITY)
Troponin I (High Sensitivity): 111 ng/L (ref <18)
Troponin I (High Sensitivity): 72 ng/L — ABNORMAL HIGH (ref <18)

## 2021-09-13 LAB — MAGNESIUM: Magnesium: 2.1 mg/dL (ref 1.7–2.4)

## 2021-09-13 LAB — HIV ANTIBODY (ROUTINE TESTING W REFLEX): HIV Screen 4th Generation wRfx: NONREACTIVE

## 2021-09-13 LAB — LACTIC ACID, PLASMA: Lactic Acid, Venous: 0.8 mmol/L (ref 0.5–1.9)

## 2021-09-13 LAB — HEMOGLOBIN A1C
Hgb A1c MFr Bld: 7.5 % — ABNORMAL HIGH (ref 4.8–5.6)
Mean Plasma Glucose: 168.55 mg/dL

## 2021-09-13 LAB — PHOSPHORUS: Phosphorus: 4.4 mg/dL (ref 2.5–4.6)

## 2021-09-13 LAB — PROCALCITONIN: Procalcitonin: 1.62 ng/mL

## 2021-09-13 MED ORDER — ASPIRIN EC 81 MG PO TBEC
81.0000 mg | DELAYED_RELEASE_TABLET | Freq: Every day | ORAL | Status: DC
  Administered 2021-09-13 – 2021-09-18 (×6): 81 mg via ORAL
  Filled 2021-09-13 (×6): qty 1

## 2021-09-13 MED ORDER — CYCLOBENZAPRINE HCL 10 MG PO TABS
10.0000 mg | ORAL_TABLET | Freq: Two times a day (BID) | ORAL | Status: DC | PRN
  Filled 2021-09-13: qty 1

## 2021-09-13 MED ORDER — HYDROMORPHONE HCL 1 MG/ML IJ SOLN
1.0000 mg | Freq: Once | INTRAMUSCULAR | Status: AC
  Administered 2021-09-13: 1 mg via INTRAVENOUS
  Filled 2021-09-13: qty 1

## 2021-09-13 MED ORDER — CEFTRIAXONE SODIUM 1 G IJ SOLR: 1.0000 g | INTRAMUSCULAR | Status: DC

## 2021-09-13 MED ORDER — INSULIN ASPART 100 UNIT/ML IJ SOLN
0.0000 [IU] | Freq: Every day | INTRAMUSCULAR | Status: DC
Start: 2021-09-13 — End: 2021-09-18
  Administered 2021-09-13 – 2021-09-15 (×3): 3 [IU] via SUBCUTANEOUS
  Administered 2021-09-16: 2 [IU] via SUBCUTANEOUS
  Administered 2021-09-17: 3 [IU] via SUBCUTANEOUS
  Filled 2021-09-13 (×5): qty 1

## 2021-09-13 MED ORDER — ACETAMINOPHEN 325 MG PO TABS
650.0000 mg | ORAL_TABLET | Freq: Four times a day (QID) | ORAL | Status: DC | PRN
  Administered 2021-09-13: 650 mg via ORAL
  Filled 2021-09-13: qty 2

## 2021-09-13 MED ORDER — FUROSEMIDE 10 MG/ML IJ SOLN
40.0000 mg | Freq: Two times a day (BID) | INTRAMUSCULAR | Status: AC
  Administered 2021-09-13 (×2): 40 mg via INTRAVENOUS
  Filled 2021-09-13: qty 4

## 2021-09-13 MED ORDER — POLYETHYLENE GLYCOL 3350 17 G PO PACK
17.0000 g | PACK | Freq: Every day | ORAL | Status: DC
  Administered 2021-09-13 – 2021-09-15 (×2): 17 g via ORAL
  Filled 2021-09-13 (×4): qty 1

## 2021-09-13 MED ORDER — AMLODIPINE BESYLATE 10 MG PO TABS
10.0000 mg | ORAL_TABLET | Freq: Every day | ORAL | Status: DC
  Administered 2021-09-13 – 2021-09-18 (×6): 10 mg via ORAL
  Filled 2021-09-13 (×6): qty 1

## 2021-09-13 MED ORDER — SALINE SPRAY 0.65 % NA SOLN
1.0000 | NASAL | Status: DC | PRN
  Filled 2021-09-13: qty 44

## 2021-09-13 MED ORDER — DULOXETINE HCL 30 MG PO CPEP
60.0000 mg | ORAL_CAPSULE | Freq: Two times a day (BID) | ORAL | Status: DC
  Administered 2021-09-13 – 2021-09-18 (×11): 60 mg via ORAL
  Filled 2021-09-13 (×11): qty 2

## 2021-09-13 MED ORDER — VANCOMYCIN HCL 1250 MG/250ML IV SOLN: 1250.0000 mg | INTRAVENOUS | Status: DC

## 2021-09-13 MED ORDER — FUROSEMIDE 10 MG/ML IJ SOLN
40.0000 mg | Freq: Once | INTRAMUSCULAR | Status: DC
Start: 2021-09-14 — End: 2021-09-13

## 2021-09-13 MED ORDER — HYDROMORPHONE HCL 1 MG/ML IJ SOLN
0.2500 mg | Freq: Once | INTRAMUSCULAR | Status: AC
  Administered 2021-09-13: 0.25 mg via INTRAVENOUS
  Filled 2021-09-13: qty 1

## 2021-09-13 MED ORDER — LORAZEPAM 2 MG/ML IJ SOLN
0.5000 mg | INTRAMUSCULAR | Status: DC | PRN
  Administered 2021-09-13 – 2021-09-18 (×17): 0.5 mg via INTRAVENOUS
  Filled 2021-09-13 (×17): qty 1

## 2021-09-13 MED ORDER — TRAMADOL HCL 50 MG PO TABS
50.0000 mg | ORAL_TABLET | Freq: Two times a day (BID) | ORAL | Status: DC | PRN
  Administered 2021-09-13 – 2021-09-17 (×6): 50 mg via ORAL
  Filled 2021-09-13 (×6): qty 1

## 2021-09-13 MED ORDER — PREGABALIN 75 MG PO CAPS
150.0000 mg | ORAL_CAPSULE | Freq: Three times a day (TID) | ORAL | Status: DC
  Administered 2021-09-13 – 2021-09-18 (×16): 150 mg via ORAL
  Filled 2021-09-13 (×16): qty 2

## 2021-09-13 MED ORDER — LABETALOL HCL 5 MG/ML IV SOLN
10.0000 mg | INTRAVENOUS | Status: DC | PRN
  Administered 2021-09-13: 10 mg via INTRAVENOUS
  Filled 2021-09-13: qty 4

## 2021-09-13 MED ORDER — SPIRONOLACTONE 25 MG PO TABS
25.0000 mg | ORAL_TABLET | Freq: Every day | ORAL | Status: DC
  Administered 2021-09-13: 25 mg via ORAL
  Filled 2021-09-13: qty 1

## 2021-09-13 MED ORDER — VANCOMYCIN HCL 2000 MG/400ML IV SOLN
2000.0000 mg | Freq: Once | INTRAVENOUS | Status: AC
  Administered 2021-09-13: 2000 mg via INTRAVENOUS
  Filled 2021-09-13: qty 400

## 2021-09-13 MED ORDER — CARVEDILOL 25 MG PO TABS
25.0000 mg | ORAL_TABLET | Freq: Two times a day (BID) | ORAL | Status: DC
  Administered 2021-09-13 – 2021-09-18 (×11): 25 mg via ORAL
  Filled 2021-09-13: qty 1
  Filled 2021-09-13 (×4): qty 2
  Filled 2021-09-13: qty 1
  Filled 2021-09-13 (×4): qty 2
  Filled 2021-09-13: qty 1

## 2021-09-13 MED ORDER — INSULIN GLARGINE-YFGN 100 UNIT/ML ~~LOC~~ SOPN
10.0000 [IU] | PEN_INJECTOR | Freq: Every day | SUBCUTANEOUS | Status: DC
  Filled 2021-09-13: qty 3

## 2021-09-13 MED ORDER — CHLORHEXIDINE GLUCONATE CLOTH 2 % EX PADS
6.0000 | MEDICATED_PAD | Freq: Every day | CUTANEOUS | Status: DC
  Administered 2021-09-13 – 2021-09-18 (×6): 6 via TOPICAL

## 2021-09-13 MED ORDER — METHOCARBAMOL 750 MG PO TABS
750.0000 mg | ORAL_TABLET | Freq: Two times a day (BID) | ORAL | Status: DC | PRN
  Administered 2021-09-13: 750 mg via ORAL
  Filled 2021-09-13 (×2): qty 1

## 2021-09-13 MED ORDER — CLONIDINE HCL 0.1 MG PO TABS
0.1000 mg | ORAL_TABLET | Freq: Two times a day (BID) | ORAL | Status: DC
  Administered 2021-09-13 – 2021-09-18 (×11): 0.1 mg via ORAL
  Filled 2021-09-13 (×12): qty 1

## 2021-09-13 MED ORDER — INSULIN GLARGINE-YFGN 100 UNIT/ML ~~LOC~~ SOLN
10.0000 [IU] | Freq: Every day | SUBCUTANEOUS | Status: DC
  Administered 2021-09-13: 10 [IU] via SUBCUTANEOUS
  Filled 2021-09-13 (×2): qty 0.1

## 2021-09-13 MED ORDER — ISOSORBIDE MONONITRATE ER 30 MG PO TB24
30.0000 mg | ORAL_TABLET | Freq: Every day | ORAL | Status: DC
  Administered 2021-09-13 – 2021-09-18 (×6): 30 mg via ORAL
  Filled 2021-09-13 (×6): qty 1

## 2021-09-13 MED ORDER — SODIUM CHLORIDE 0.9 % IV SOLN
1.0000 g | Freq: Two times a day (BID) | INTRAVENOUS | Status: DC
  Administered 2021-09-13: 1 g via INTRAVENOUS
  Filled 2021-09-13: qty 1

## 2021-09-13 MED ORDER — FUROSEMIDE 10 MG/ML IJ SOLN
40.0000 mg | Freq: Once | INTRAMUSCULAR | Status: AC
  Administered 2021-09-14: 40 mg via INTRAVENOUS
  Filled 2021-09-13: qty 4

## 2021-09-13 MED ORDER — OLANZAPINE 5 MG PO TABS
5.0000 mg | ORAL_TABLET | Freq: Every day | ORAL | Status: DC
  Administered 2021-09-13 – 2021-09-17 (×5): 5 mg via ORAL
  Filled 2021-09-13 (×6): qty 1

## 2021-09-13 MED ORDER — POTASSIUM CHLORIDE CRYS ER 10 MEQ PO TBCR
10.0000 meq | EXTENDED_RELEASE_TABLET | Freq: Every day | ORAL | Status: DC
  Administered 2021-09-13 – 2021-09-17 (×5): 10 meq via ORAL
  Filled 2021-09-13 (×6): qty 1

## 2021-09-13 MED ORDER — LOSARTAN POTASSIUM 50 MG PO TABS
100.0000 mg | ORAL_TABLET | Freq: Every day | ORAL | Status: DC
  Administered 2021-09-13: 100 mg via ORAL
  Filled 2021-09-13: qty 2

## 2021-09-13 MED ORDER — INSULIN ASPART 100 UNIT/ML IJ SOLN
0.0000 [IU] | Freq: Three times a day (TID) | INTRAMUSCULAR | Status: DC
  Administered 2021-09-14: 8 [IU] via SUBCUTANEOUS
  Administered 2021-09-14 (×2): 5 [IU] via SUBCUTANEOUS
  Administered 2021-09-15: 8 [IU] via SUBCUTANEOUS
  Administered 2021-09-15 (×2): 5 [IU] via SUBCUTANEOUS
  Administered 2021-09-16: 8 [IU] via SUBCUTANEOUS
  Administered 2021-09-16: 5 [IU] via SUBCUTANEOUS
  Administered 2021-09-16: 3 [IU] via SUBCUTANEOUS
  Administered 2021-09-17: 11 [IU] via SUBCUTANEOUS
  Administered 2021-09-17 (×2): 5 [IU] via SUBCUTANEOUS
  Administered 2021-09-18: 8 [IU] via SUBCUTANEOUS
  Administered 2021-09-18: 3 [IU] via SUBCUTANEOUS
  Filled 2021-09-13 (×14): qty 1

## 2021-09-13 MED ORDER — INSULIN ASPART 100 UNIT/ML IJ SOLN
0.0000 [IU] | Freq: Three times a day (TID) | INTRAMUSCULAR | Status: DC
  Administered 2021-09-13: 5 [IU] via SUBCUTANEOUS

## 2021-09-13 MED ORDER — OXYCODONE HCL 5 MG PO TABS
5.0000 mg | ORAL_TABLET | Freq: Four times a day (QID) | ORAL | Status: DC | PRN
  Administered 2021-09-13 – 2021-09-18 (×8): 5 mg via ORAL
  Filled 2021-09-13 (×9): qty 1

## 2021-09-13 MED ORDER — VANCOMYCIN HCL 500 MG/100ML IV SOLN
500.0000 mg | Freq: Once | INTRAVENOUS | Status: AC
  Administered 2021-09-13: 500 mg via INTRAVENOUS
  Filled 2021-09-13: qty 100

## 2021-09-13 MED ORDER — ATORVASTATIN CALCIUM 10 MG PO TABS
10.0000 mg | ORAL_TABLET | Freq: Every day | ORAL | Status: DC
  Administered 2021-09-13 – 2021-09-18 (×6): 10 mg via ORAL
  Filled 2021-09-13 (×7): qty 1

## 2021-09-13 MED ORDER — SODIUM CHLORIDE 0.9 % IV SOLN
2.0000 g | INTRAVENOUS | Status: AC
  Administered 2021-09-13 – 2021-09-17 (×5): 2 g via INTRAVENOUS
  Filled 2021-09-13: qty 2
  Filled 2021-09-13: qty 20
  Filled 2021-09-13: qty 2
  Filled 2021-09-13 (×2): qty 20

## 2021-09-13 NOTE — Progress Notes (Signed)
Pharmacy Antibiotic Note  Rebecca Campbell is a 50 y.o. female admitted on 09/12/2021 with pneumonia.  Pharmacy has been consulted for Vanc, meropenem dosing.  Plan: Meropenem 1 gm IV Q12H ordered to start on 9/19 @ ~ 0200.  Vancomycin 2500 mg IV X 1 loading given on 9/19 @ ~ 0100. Vancomycin 1250 mg IV Q48H ordered to start on 9/21 @ 0100.  AUC = 502.4 Vanc trough = 12.2   Height: 5\' 1"  (154.9 cm) Weight: 113.4 kg (250 lb) IBW/kg (Calculated) : 47.8  Temp (24hrs), Avg:98.1 F (36.7 C), Min:98.1 F (36.7 C), Max:98.1 F (36.7 C)  Recent Labs  Lab 09/12/21 1936 09/12/21 1937  WBC 14.6*  --   CREATININE 2.35*  --   LATICACIDVEN  --  1.6    Estimated Creatinine Clearance: 32.7 mL/min (A) (by C-G formula based on SCr of 2.35 mg/dL (H)).    Allergies  Allergen Reactions   Keflex [Cephalexin]    Latex    Morphine And Related    Nafcillin Sodium In Dextrose    Sulfa Antibiotics     Antimicrobials this admission:   >>   >>   Dose adjustments this admission:   Microbiology results:  BCx:   UCx:    Sputum:    MRSA PCR:   Thank you for allowing pharmacy to be a part of this patient's care.  Karlei Waldo D 09/13/2021 1:59 AM

## 2021-09-13 NOTE — TOC Initial Note (Signed)
Transition of Care [facility]) - Initial/Assessment Note    Patient Details  Name: Rebecca Campbell MRN: [id number] Date of Birth: Jul 12, 1971  Transition of Care [facility]) CM/SW Contact:    Anselm Pancoast, RN Phone Number: 09/13/2021, 11:47 AM  Clinical Narrative:                 Reviewed patient during rounds. Anticipate discharge home with husband. Husband has been at bedside and very supportive however has left to return to work for a few hours today.         Patient Goals and CMS Choice        Expected Discharge Plan and Services                                                Prior Living Arrangements/Services                       Activities of Daily Living      Permission Sought/Granted                  Emotional Assessment              Admission diagnosis:  Acute respiratory failure ([place]) [J96.00] Acute pulmonary edema ([place]) [J81.0] Respiratory distress [R06.03] Acute on chronic respiratory failure (H[facility]) [J96.20] Patient Active Problem List   Diagnosis Date Noted   Acute on chronic diastolic (congestive) heart failure ([place]) 09/13/2021   Chronic pain 09/13/2021   Anxiety 09/13/2021   Depression 09/13/2021   Acute kidney injury superimposed on chronic kidney disease ([place]) 09/13/2021   Acute respiratory failure ([place]) 09/12/2021   PCP:  Pcp, No Pharmacy:  No Pharmacies Listed    Social Determinants of Health (SDOH) Interventions    Readmission Risk Interventions No flowsheet data found.

## 2021-09-13 NOTE — ED Notes (Signed)
Report given to Ty RN in ICU

## 2021-09-13 NOTE — Consult Note (Addendum)
PHARMACY CONSULT NOTE  Pharmacy Consult for Electrolyte Monitoring and Replacement   Recent Labs: Potassium (mmol/L)  Date Value  09/13/2021 4.2   Magnesium (mg/dL)  Date Value  09/13/2021 2.1   Calcium (mg/dL)  Date Value  09/13/2021 8.9   Albumin (g/dL)  Date Value  09/12/2021 3.5   Phosphorus (mg/dL)  Date Value  09/13/2021 4.4   Sodium (mmol/L)  Date Value  09/13/2021 146 (H)   Assessment: Patient is a 50 y/o F with medical history including metastatic colon cancer with lung metastases, diabetes, CAD, HTN, HLD, morbid obesity, asthma, CKD, HFpEF, hepatic steatosis, gout, PTSD who is admitted with acute on chronic respiratory failure. Pharmacy consulted to assist with electrolyte monitoring and replacement as indicated.   Diuretics: IV Lasix 40 mg BID + Aldactone  Goal of Therapy:  Electrolyte within normal limits  Plan:  --Mild hypernatremia with Na 146. Likely secondary to diuresis --K 4.2, continue Kcl 10 mEq PO daily --No electrolyte replacement warranted at this time --Will follow-up electrolytes with AM labs tomorrow  Benita Gutter 09/13/2021 8:13 AM

## 2021-09-13 NOTE — Progress Notes (Signed)
NAME:  Rebecca Campbell, MRN:  [id number], DOB:  Dec 20, 1971, LOS: 1 ADMISSION DATE:  09/12/2021, CONSULTATION DATE: 09/12/2021 REFERRING MD: Dr. Charlsie Quest, CHIEF COMPLAINT:   Respiratory distress  History of Present Illness:  50 yo F presented at [facility] ED on 09/12/21 from home with husband due to worsening shortness of breath over the past 2-3 days.  Of note patient restarted chemotherapy treatment 2 days ago on Friday, 09/10/2021, after a hiatus from treatment due to COVID-19 infection and hospitalization in August 2022.  Patient and her husband who is bedside deny any recent sick contacts, fever/chills, GI symptoms out of the ordinary intermittent diarrhea.  Patient does endorse a chronic cough with some worsening darker sputum over the last 2 days.  She also admits to pain across her chest down her sides and her back that she reports is because of her "hard breathing".  ED course: Upon arrival patient was in respiratory distress, per ED documentation: Tripoding /guppy breathing in an extremis being bagged by EMS.  First SPO2 reading with a good waveform was 18%.  Patient was placed on BiPAP at 100% FiO2, received 80 mg Lasix, 125 mg Solu-Medrol & Duo-Neb.  She was also tachycardic and hypertensive, Nitro drip started.  Patient stabilized and PCCM was consulted for admission to ICU due to high risk for intubation.  Initial vitals: Afebrile at 98.1, tachypneic at 41, tachycardic at 129, hypertensive at 185/107 & SpO2 94% on BIPAP at 100% fio2. Significant labs: (Labs/ Imaging personally reviewed) Na+/ K+: 144/ 4.0, BUN/Cr.: 46/ 2.35, Serum CO2/ AG: 29/ 6 Hgb: 10.8, WBC: 14.6, Lactic/ PCT: 1.6/ < 0.10 Troponin: 40 BNP: 354.6  ABG: 7.28/ 57/ 68/ 26.8 CXR 09/12/21: shallow inspiration and kyphotic positioning limit examination. Probable perihilar edema vs pneumonia. Pertinent  Medical History  Colon Cancer with lung mets (4-5 L [place] chronic O2) T2DM CAD HTN HLD Morbid Obesity Asthma CKD stage  4 HFpEF PTSD Fatty liver Gout COVID-19 (08/02/2021) Significant Hospital Events: Including procedures, antibiotic start and stop dates in addition to other pertinent events   09/12/21: Patient admitted in respiratory distress requiring BIPAP support 09/13/21: Pt remains off Bipap and tolerating well   Interim History / Subjective:  Pt off Bipap currently on 5L O2 via nasal canula with O2 sats upper 90's with no s/sx of respiratory distress.  Pt c/o lower back pain and intermittent chest pain states "I feel like someone's sitting on my chest." She states the pain is worse when she coughs.  Pts bp currently 140's following restarting outpatient antihypertensives and with pain management .  Objective   Blood pressure (!) 181/89, pulse (!) 109, temperature 98.2 F (36.8 C), temperature source Oral, resp. rate 14, height 5\' 1"  (1.549 m), weight 113.4 kg, SpO2 98 %.    FiO2 (%):  [80 %] 80 %   Intake/Output Summary (Last 24 hours) at 09/13/2021 1345 Last data filed at 09/13/2021 0800 Gross per 24 hour  Intake 218.36 ml  Output 1780 ml  Net -1561.64 ml   Filed Weights   09/13/21 0006  Weight: 113.4 kg    Examination: General: Chronically ill appearing female, NAD on 5L O2 via nasal canula, NAD HEENT: MM pink/moist, anicteric, atraumatic, neck supple Neuro: Alert and oriented, follows commands, PERRLA CV: NSR, s1s2, no M/R/G, reproducible chest pain (pleuritic), 2+ radial/1+ distal pulses, 1+ bilateral lower extremity edema Pulm: Crackles bilateral bases, even, non labored GI: Soft, obese, reducible umbilical hernia present, non tender, +BS x4 GU: Pure wick in  place with clear yellow urine Skin: Limited exam, no rashes/lesions noted MUSCUL: Severe kyphosis  Extremities: Warm/dry, normal bulk   Resolved Hospital Problem list     Assessment & Plan:  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 secondary to pulmonary edema in the setting of hypertensive  urgency Restrictive lung disease  Possible CAP in an immunocompromised patient PMHx: colon cancer with lung mets, kyphosis, COVID-19, asthma, chronic O2 use - Supplemental O2 to maintain SpO2 > 90% - Bipap qhs  - Intermittent chest x-ray & ABG PRN - Ensure adequate pulmonary hygiene  - F/u cultures, trend PCT - Continue CAP coverage: will discontinue meropenum and vancomycin and start ceftriaxone for 5 days  - bronchodilators PRN  Acute on Chronic HFpEF exacerbation suspect secondary to hypertensive urgency  Elevated troponin secondary to suspected demand ischemia HTN Intermittent chest pain likely pleuritic PMHx: HFpEF, HTN, HLD Troponin: 40, BNP: 354.6 - Echocardiogram results pending  - Repeat troponin until peaked  - hydralazine & labetalol IVP PRN's ordered, transition off nitroglycerin drip as tolerated. Goal SBP < 160 - Continue outpatient ASA, atorvastatin, clonidine, losartan, carvedilol, norvasc, and spironolactone  - Continuous cardiac monitoring  - Daily weights to assess volume status - Continue diuresis, 40 mg BID IV lasix x 2 doses- reassess daily   Acute Kidney Injury superimposed on CKD Stage 3b Query cardiorenal syndrome Baseline Cr: 2.0, Cr on admission: 2.35 - Strict I/O's: alert provider if UOP < 0.5 mL/kg/hr - Daily BMP, replace electrolytes PRN - Avoid nephrotoxic agents as able, ensure adequate renal perfusion  Chronic Pain in the setting of Kyphosis PMHx: Anxiety & Depression, lumbago - Continue outpatient lorazepam 0.5 PRN - Continue outpatient: lexapro, zyprexa, oxycodone, flexeril, methocarbamol, cymbalta, lyrica, tramadol - Prn ativan for anxiety - Q2 repositioning & turns  Type 2 Diabetes Mellitus Hemoglobin A1C: 7.5 - Monitor CBG Q 4 hours - SSI moderate dosing - target range while in ICU: 140-180 - follow ICU hyper/hypo-glycemia protocol  Colon cancer with metastasis to the lung (managed by Oncology at [facility]) Last chemo session: Friday  09/10/21 - Supportive care - Chemo precautions  Best Practice (right click and "Reselect all SmartList Selections" daily)  Diet/type: Heart healthy/carb modified  DVT prophylaxis: prophylactic heparin  GI prophylaxis: Not indicated  Lines: N/A Foley:  N/A Code Status:  full code Last date of multidisciplinary goals of care discussion [09/13/21]  Labs   CBC: Recent Labs  Lab 09/12/21 1936 09/13/21 0420  WBC 14.6* 9.8  NEUTROABS 11.0*  --   HGB 10.8* 10.3*  HCT 33.0* 32.6*  MCV 93.5 92.6  PLT 174 139*    Basic Metabolic Panel: Recent Labs  Lab 09/12/21 1936 09/13/21 0420  NA 144 146*  K 4.0 4.2  CL 109 110  CO2 29 26  GLUCOSE 245* 189*  BUN 46* 50*  CREATININE 2.35* 2.21*  CALCIUM 8.8* 8.9  MG  --  2.1  PHOS  --  4.4   GFR: Estimated Creatinine Clearance: 34.8 mL/min (A) (by C-G formula based on SCr of 2.21 mg/dL (H)). Recent Labs  Lab 09/12/21 1936 09/12/21 1937 09/13/21 0420  PROCALCITON <0.10  --  1.62  WBC 14.6*  --  9.8  LATICACIDVEN  --  1.6 0.8    Liver Function Tests: Recent Labs  Lab 09/12/21 1936  AST 19  ALT 18  ALKPHOS 84  BILITOT 0.8  PROT 7.2  ALBUMIN 3.5   No results for input(s): LIPASE, AMYLASE in the last 168 hours. No  results for input(s): AMMONIA in the last 168 hours.  ABG    Component Value Date/Time   PHART 7.28 (L) 09/12/2021 1936   PCO2ART 57 (H) 09/12/2021 1936   PO2ART 68 (L) 09/12/2021 1936   HCO3 26.8 09/12/2021 1936   ACIDBASEDEF 0.5 09/12/2021 1936   O2SAT 90.7 09/12/2021 1936     Coagulation Profile: Recent Labs  Lab 09/12/21 1936  INR 1.1    Cardiac Enzymes: No results for input(s): CKTOTAL, CKMB, CKMBINDEX, TROPONINI in the last 168 hours.  HbA1C: Hgb A1c MFr Bld  Date/Time Value Ref Range Status  09/13/2021 04:20 AM 7.5 (H) 4.8 - 5.6 % Final    Comment:    (NOTE) Pre diabetes:          5.7%-6.4%  Diabetes:              >6.4%  Glycemic control for   <7.0% adults with diabetes      CBG: Recent Labs  Lab 09/13/21 0049 09/13/21 0238 09/13/21 0751 09/13/21 1158  GLUCAP 201* 184* 183* 201*    Review of Systems: Positives in BOLD  Gen: Denies fever, chills, weight change, fatigue, night sweats, lower back pain HEENT: Denies blurred vision, double vision, hearing loss, tinnitus, sinus congestion, rhinorrhea, sore throat, neck stiffness, dysphagia PULM: Denies shortness of breath, cough, sputum production, hemoptysis, wheezing CV: intermittent chest pain, edema, orthopnea, paroxysmal nocturnal dyspnea, palpitations GI: Denies abdominal pain, nausea, vomiting, diarrhea, hematochezia, melena, constipation, change in bowel habits GU: Denies dysuria, hematuria, polyuria, oliguria, urethral discharge Endocrine: Denies hot or cold intolerance, polyuria, polyphagia or appetite change Derm: Denies rash, dry skin, scaling or peeling skin change Heme: Denies easy bruising, bleeding, bleeding gums Neuro: Denies headache, numbness, weakness, slurred speech, loss of memory or consciousness  Past Medical History:  She,  has a past medical history of Albuminuria, Asthma, Atypical chest pain, Cancer ([place]), Chronic pain, Depression, Diabetes mellitus without complication ([place]), Dysthymic disorder, Gouty arthropathy, Hyperlipidemia, Hypertension, Hypertensive emergency, Hyposomnia, insomnia, or sleeplessness associated with conditioned arousal, Lumbago, Morbid obesity ([place]), and Urinary incontinence.   Surgical History:   Past Surgical History:  Procedure Laterality Date   COLONOSCOPY W/ BIOPSIES     ESOPHAGOGASTRODUODENOSCOPY       Social History:   reports that she does not currently use alcohol. She reports that she does not currently use drugs.   Family History:  Her family history is not on file.   Allergies Allergies  Allergen Reactions   Keflex [Cephalexin]    Latex    Morphine And Related    Nafcillin Sodium In Dextrose    Sulfa Antibiotics      Home  Medications  Prior to Admission medications   Not on File     Critical care time: 30 minutes     Rosilyn Mings, [place] Pager [phone] (please enter 7 digits) PCCM Consult Pager [phone] (please enter 7 digits)

## 2021-09-13 NOTE — Progress Notes (Signed)
[place] Progress Note Patient Name: COURTENEY ALDERETE DOB: June 23, 1971 MRN: [id number]   Date of Service  09/13/2021  HPI/Events of Note  Brief HPI: 26 F. history of colon cancer metastasized to the lung receiving majority of care at [facility].  COVID-19 last month.  Morbid obesity, HTN DM and CHF.  Chronic respiratory failure on 4-5 L home O2  Admitting for AHRF from Pulmonary edema,  restrictive lung dx in setting of scoliosis & morbid obesity, restarted chemo regimen past Fri for colorectal cancer with lung mets- on BiPAP.   AKI on CKD  Leukocytosis, normal LA and pro cal low. On meropenum. Source not clear, could be lung-PNA.   Notes, labs reviewed. Discussed with NP.  Data: Reviewed. UA neg. Covid/flu neg. Wbc 14.6 K. Hg 10.8  LA 1.6 CxR film seen: cardiomegaly, obese chest. Perihilar congestion 7.28/57/68 Cr 2.35 BNP 354 Trop 40 Pro cal low < 0.10  Camera: Tolerating BiPAP. 16/8/22/60%.  Sinus tachy 101, sats 100%, MAP 125 RR 20's. No abdominal breathing. Able to protect airways.    eICU Interventions  - received nebs, lasix. On BiPAP - follow ABG prn - aspiration precautions - on vanc/meropenum. De escalate abx based on culture reports.  - follow cultures and de escalate abx accordingly - CBG goals < 180/SSI.  - VTE:  SQ heparin.      Intervention Category Major Interventions: Respiratory failure - evaluation and management Evaluation Type: New Patient Evaluation  Elmer Sow 09/13/2021, 12:28 AM

## 2021-09-13 NOTE — Progress Notes (Signed)
Inpatient Diabetes Program Recommendations  AACE/ADA: New Consensus Statement on Inpatient Glycemic Control (2015)  Target Ranges:  Prepandial:   less than 140 mg/dL      Peak postprandial:   less than 180 mg/dL (1-2 hours)      Critically ill patients:  140 - 180 mg/dL   Results for MEILY, GLOWACKI (MRN [id number]) as of 09/13/2021 14:00  Ref. Range 09/13/2021 00:49 09/13/2021 02:38 09/13/2021 07:51 09/13/2021 11:58  Glucose-Capillary Latest Ref Range: 70 - 99 mg/dL 201 (H)  5 units Novolog  184 (H)  3 units Novolog  183 (H)  3 units Novolog  201 (H)  5 units Novolog    Home DM Meds: 70/30 Insulin 70 units QAM     Regular 45 units QHS?     Jardiance 10 mg daily     Trulicity 3 mg Qweek    Current Orders: Novolog 0-15 units ac/hs    Pt got 125 mg Solumedrol X 1 dose at 10pm last night   MD- Note patient takes 70/30 Insulin at home.  Currenty only receiving Novolog SSi at present.  CBGs 180-200 today.  Please consider adding weight-based dose of basal insulin:  Semglee 10 units Daily to start (~0.1 units/kg)    --Will follow patient during hospitalization--  Wyn Quaker RN, MSN, CDE Diabetes Coordinator Inpatient Glycemic Control Team Team Pager: [phone] (8a-5p)

## 2021-09-14 ENCOUNTER — Inpatient Hospital Stay
Admit: 2021-09-14 | Discharge: 2021-09-14 | Disposition: A | Discharge status: Home / Self Care | Payer: Medicare HMO | Attending: Pulmonary Disease | Admitting: Pulmonary Disease

## 2021-09-14 DIAGNOSIS — J9601 Acute respiratory failure with hypoxia: Secondary | ICD-10-CM | POA: Diagnosis not present

## 2021-09-14 LAB — CBC WITH DIFFERENTIAL/PLATELET
Abs Immature Granulocytes: 0.02 10*3/uL (ref 0.00–0.07)
Basophils Absolute: 0 10*3/uL (ref 0.0–0.1)
Basophils Relative: 0 %
Eosinophils Absolute: 0 10*3/uL (ref 0.0–0.5)
Eosinophils Relative: 0 %
HCT: 23.7 % — ABNORMAL LOW (ref 36.0–46.0)
Hemoglobin: 7.5 g/dL — ABNORMAL LOW (ref 12.0–15.0)
Immature Granulocytes: 0 %
Lymphocytes Relative: 24 %
Lymphs Abs: 1.4 10*3/uL (ref 0.7–4.0)
MCH: 29.9 pg (ref 26.0–34.0)
MCHC: 31.6 g/dL (ref 30.0–36.0)
MCV: 94.4 fL (ref 80.0–100.0)
Monocytes Absolute: 0.3 10*3/uL (ref 0.1–1.0)
Monocytes Relative: 5 %
Neutro Abs: 4.2 10*3/uL (ref 1.7–7.7)
Neutrophils Relative %: 71 %
Platelets: 96 10*3/uL — ABNORMAL LOW (ref 150–400)
RBC: 2.51 MIL/uL — ABNORMAL LOW (ref 3.87–5.11)
RDW: 16 % — ABNORMAL HIGH (ref 11.5–15.5)
WBC: 6 10*3/uL (ref 4.0–10.5)
nRBC: 0 % (ref 0.0–0.2)

## 2021-09-14 LAB — PROCALCITONIN: Procalcitonin: 2.82 ng/mL

## 2021-09-14 LAB — ECHOCARDIOGRAM COMPLETE
Height: 61 in
S' Lateral: 2.6 cm
Weight: 4000 oz

## 2021-09-14 LAB — BASIC METABOLIC PANEL
Anion gap: 9 (ref 5–15)
BUN: 58 mg/dL — ABNORMAL HIGH (ref 6–20)
CO2: 25 mmol/L (ref 22–32)
Calcium: 8.3 mg/dL — ABNORMAL LOW (ref 8.9–10.3)
Chloride: 108 mmol/L (ref 98–111)
Creatinine, Ser: 2.73 mg/dL — ABNORMAL HIGH (ref 0.44–1.00)
GFR, Estimated: 20 mL/min — ABNORMAL LOW (ref >60)
Glucose, Bld: 272 mg/dL — ABNORMAL HIGH (ref 70–99)
Potassium: 4.2 mmol/L (ref 3.5–5.1)
Sodium: 142 mmol/L (ref 135–145)

## 2021-09-14 LAB — GLUCOSE, CAPILLARY
Glucose-Capillary: 229 mg/dL — ABNORMAL HIGH (ref 70–99)
Glucose-Capillary: 279 mg/dL — ABNORMAL HIGH (ref 70–99)
Glucose-Capillary: 215 mg/dL — ABNORMAL HIGH (ref 70–99)
Glucose-Capillary: 259 mg/dL — ABNORMAL HIGH (ref 70–99)

## 2021-09-14 LAB — MAGNESIUM: Magnesium: 2.1 mg/dL (ref 1.7–2.4)

## 2021-09-14 LAB — PHOSPHORUS: Phosphorus: 5.5 mg/dL — ABNORMAL HIGH (ref 2.5–4.6)

## 2021-09-14 MED ORDER — INSULIN GLARGINE-YFGN 100 UNIT/ML ~~LOC~~ SOLN
12.0000 [IU] | Freq: Every day | SUBCUTANEOUS | Status: DC
  Administered 2021-09-14 – 2021-09-17 (×4): 12 [IU] via SUBCUTANEOUS
  Filled 2021-09-14 (×5): qty 0.12

## 2021-09-14 MED ORDER — INSULIN ASPART 100 UNIT/ML IJ SOLN
4.0000 [IU] | Freq: Three times a day (TID) | INTRAMUSCULAR | Status: DC
  Administered 2021-09-15 – 2021-09-18 (×11): 4 [IU] via SUBCUTANEOUS
  Filled 2021-09-14 (×11): qty 1

## 2021-09-14 MED ORDER — PANTOPRAZOLE SODIUM 40 MG PO TBEC
40.0000 mg | DELAYED_RELEASE_TABLET | Freq: Every day | ORAL | Status: DC
  Administered 2021-09-14 – 2021-09-18 (×5): 40 mg via ORAL
  Filled 2021-09-14 (×5): qty 1

## 2021-09-14 NOTE — Progress Notes (Signed)
*  PRELIMINARY RESULTS* Echocardiogram 2D Echocardiogram has been performed.  Rebecca Campbell 09/14/2021, 11:53 AM

## 2021-09-14 NOTE — Progress Notes (Signed)
NAME:  Rebecca Campbell, MRN:  [id number], DOB:  01-19-1971, LOS: 2 ADMISSION DATE:  09/12/2021, CONSULTATION DATE: 09/12/2021 REFERRING MD: Dr. Charlsie Quest, CHIEF COMPLAINT:   Respiratory distress  History of Present Illness:  50 yo F presented at [facility] ED on 09/12/21 from home with husband due to worsening shortness of breath over the past 2-3 days.  Of note patient restarted chemotherapy treatment 2 days ago on Friday, 09/10/2021, after a hiatus from treatment due to COVID-19 infection and hospitalization in August 2022.  Patient and her husband who is bedside deny any recent sick contacts, fever/chills, GI symptoms out of the ordinary intermittent diarrhea.  Patient does endorse a chronic cough with some worsening darker sputum over the last 2 days.  She also admits to pain across her chest down her sides and her back that she reports is because of her "hard breathing".  ED course: Upon arrival patient was in respiratory distress, per ED documentation: Tripoding /guppy breathing in an extremis being bagged by EMS.  First SPO2 reading with a good waveform was 18%.  Patient was placed on BiPAP at 100% FiO2, received 80 mg Lasix, 125 mg Solu-Medrol & Duo-Neb.  She was also tachycardic and hypertensive, Nitro drip started.  Patient stabilized and PCCM was consulted for admission to ICU due to high risk for intubation.  Initial vitals: Afebrile at 98.1, tachypneic at 41, tachycardic at 129, hypertensive at 185/107 & SpO2 94% on BIPAP at 100% fio2. Significant labs: (Labs/ Imaging personally reviewed) Na+/ K+: 144/ 4.0, BUN/Cr.: 46/ 2.35, Serum CO2/ AG: 29/ 6 Hgb: 10.8, WBC: 14.6, Lactic/ PCT: 1.6/ < 0.10 Troponin: 40 BNP: 354.6  ABG: 7.28/ 57/ 68/ 26.8 CXR 09/12/21: shallow inspiration and kyphotic positioning limit examination. Probable perihilar edema vs pneumonia. Pertinent  Medical History  Colon Cancer with lung mets (4-5 [place] chronic O2) T2DM CAD HTN HLD Morbid Obesity Asthma CKD stage  4 HFpEF PTSD Fatty liver Gout COVID-19 (08/02/2021) Significant Hospital Events: Including procedures, antibiotic start and stop dates in addition to other pertinent events   09/12/21: Patient admitted in respiratory distress requiring BIPAP support 09/13/21: Pt remains off Bipap and tolerating well  09/14/21: Diuresed 1.6L yesterday, holding diuresis today due to increase in Creatinine  Interim History / Subjective:  -No acute events reported by nursing overnight -On BiPAP overnight ~ plan to wean to [place] today when awake -Afebrile, hemodynamically stable -Diuresed 1.6L yesterday (received 40 mg Lasix x2 doses) ~ will hold diuresis today due to worsening Creatinine of 2.73 (2.21 yesterday) -WBC improved to 6K (9.8K yesterday) -Noted to be anemic (Hgb 7.5) and thrombocytopenic (Plt 96) ~ felt to be due to chemotherapy last Friday 9/16 -Echocardiogram pending  Objective   Blood pressure 124/64, pulse 72, temperature 98.1 F (36.7 C), temperature source Axillary, resp. rate 16, height 5\' 1"  (1.549 m), weight 113.4 kg, SpO2 100 %.    FiO2 (%):  [40 %] 40 %   Intake/Output Summary (Last 24 hours) at 09/14/2021 0941 Last data filed at 09/14/2021 0230 Gross per 24 hour  Intake 100 ml  Output 800 ml  Net -700 ml    Filed Weights   09/13/21 0006  Weight: 113.4 kg    Examination: General: Chronically ill appearing female, on BiPAP and tolerating, in  NAD  HEENT: MM pink/moist, anicteric, atraumatic, neck supple Neuro: Sleeping, arouses easily to voice, Alert and oriented, follows commands, no focal deficits, speech clear, pupils PERRLA CV: NSR, s1s2, no M/R/G, 2+ radial/1+ distal pulses,  1+ bilateral lower extremity edema Pulm: Clear diminished breath sounds throughout, BiPAP assisted, even, non labored GI: Soft, obese, reducible umbilical hernia present, non tender, +BS x4 GU: Pure wick in place with clear yellow urine Skin: Limited exam, no rashes/lesions noted MUSCUL: Severe  kyphosis  Extremities: Warm/dry, normal bulk   Resolved Hospital Problem list     Assessment & Plan:  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 secondary to pulmonary edema in the setting of hypertensive urgency Restrictive lung disease  Possible CAP in an immunocompromised patient PMHx: colon cancer with lung mets, kyphosis, COVID-19, asthma, chronic O2 use - Supplemental O2 to maintain SpO2 > 90% - Bipap qhs  - Intermittent chest x-ray & ABG PRN - Ensure adequate pulmonary hygiene  - F/u cultures, trend PCT -Diuresis as BP and renal function permits - Continue CAP coverage with Ceftriaxone (plan for 5 days)  - bronchodilators PRN  Acute on Chronic HFpEF exacerbation suspect secondary to hypertensive urgency  Elevated troponin secondary to suspected demand ischemia HTN Intermittent chest pain likely pleuritic PMHx: HFpEF, HTN, HLD Troponin: 40, BNP: 354.6 -Continuous cardiac monitoring -Maintain MAP >65 -HS Troponin peaked at 111 -Echocardiogram pending - Weaned of Nitroglycerin gtt ~ hydralazine & labetalol IVP PRN's to maintain Goal SBP < 160 - Continue outpatient ASA, atorvastatin, clonidine, losartan, carvedilol, norvasc, and spironolactone  -Diuresis as BP and renal function permits ~ holding diuresis today 9/20 due to worsening Creatinine  Acute Kidney Injury superimposed on CKD Stage 3b Query cardiorenal syndrome Baseline Cr: 2.0, Cr on admission: 2.35 -Monitor I&O's / urinary output -Follow BMP -Ensure adequate renal perfusion -Avoid nephrotoxic agents as able -Replace electrolytes as indicated  Anemia without s/sx of overt blood loss & Thrombocytopenia ~ felt to be due to recent Chemotherapy on 09/10/21 -Monitor for S/Sx of bleeding -Trend CBC -Heparin SQ for VTE Prophylaxis  -Transfuse for Hgb <7 -Transfuse platelets for platelet count <50 K with active bleeding  Chronic Pain in the setting of Kyphosis PMHx: Anxiety & Depression,  lumbago - Continue outpatient lorazepam 0.5 PRN - Continue outpatient: lexapro, zyprexa, oxycodone, flexeril, methocarbamol, cymbalta, lyrica, tramadol - Prn ativan for anxiety - Q2 repositioning & turns  Type 2 Diabetes Mellitus Hemoglobin A1C: 7.5 - Monitor CBG Q 4 hours - SSI moderate dosing - target range while in ICU: 140-180 - follow ICU hyper/hypo-glycemia protocol  Colon cancer with metastasis to the lung (managed by Oncology at [facility]) Last chemo session: Friday 09/10/21 - Supportive care - Chemo precautions  Best Practice (right click and "Reselect all SmartList Selections" daily)  Diet/type: Heart healthy/carb modified  DVT prophylaxis: prophylactic heparin  GI prophylaxis: Not indicated  Lines: N/A Foley:  N/A Code Status:  full code Last date of multidisciplinary goals of care discussion [09/14/21]  Dr. Erin Fulling updated pt's family at bedside 09/14/21.  Labs   CBC: Recent Labs  Lab 09/12/21 1936 09/13/21 0420 09/14/21 0419  WBC 14.6* 9.8 6.0  NEUTROABS 11.0*  --  4.2  HGB 10.8* 10.3* 7.5*  HCT 33.0* 32.6* 23.7*  MCV 93.5 92.6 94.4  PLT 174 139* 96*     Basic Metabolic Panel: Recent Labs  Lab 09/12/21 1936 09/13/21 0420 09/14/21 0419  NA 144 146* 142  K 4.0 4.2 4.2  CL 109 110 108  CO2 29 26 25   GLUCOSE 245* 189* 272*  BUN 46* 50* 58*  CREATININE 2.35* 2.21* 2.73*  CALCIUM 8.8* 8.9 8.3*  MG  --  2.1 2.1  PHOS  --  4.4 5.5*    GFR: Estimated Creatinine Clearance: 28.2 mL/min (A) (by C-G formula based on SCr of 2.73 mg/dL (H)). Recent Labs  Lab 09/12/21 1936 09/12/21 1937 09/13/21 0420 09/14/21 0419  PROCALCITON <0.10  --  1.62 2.82  WBC 14.6*  --  9.8 6.0  LATICACIDVEN  --  1.6 0.8  --      Liver Function Tests: Recent Labs  Lab 09/12/21 1936  AST 19  ALT 18  ALKPHOS 84  BILITOT 0.8  PROT 7.2  ALBUMIN 3.5    No results for input(s): LIPASE, AMYLASE in the last 168 hours. No results for input(s): AMMONIA in the last 168  hours.  ABG    Component Value Date/Time   PHART 7.28 (L) 09/12/2021 1936   PCO2ART 57 (H) 09/12/2021 1936   PO2ART 68 (L) 09/12/2021 1936   HCO3 26.8 09/12/2021 1936   ACIDBASEDEF 0.5 09/12/2021 1936   O2SAT 90.7 09/12/2021 1936      Coagulation Profile: Recent Labs  Lab 09/12/21 1936  INR 1.1     Cardiac Enzymes: No results for input(s): CKTOTAL, CKMB, CKMBINDEX, TROPONINI in the last 168 hours.  HbA1C: Hgb A1c MFr Bld  Date/Time Value Ref Range Status  09/13/2021 04:20 AM 7.5 (H) 4.8 - 5.6 % Final    Comment:    (NOTE) Pre diabetes:          5.7%-6.4%  Diabetes:              >6.4%  Glycemic control for   <7.0% adults with diabetes     CBG: Recent Labs  Lab 09/13/21 0751 09/13/21 1158 09/13/21 1534 09/13/21 2125 09/14/21 0833  GLUCAP 183* 201* 237* 251* 229*     Review of Systems: Positives in BOLD  Currently denies all complaints Gen: Denies fever, chills, weight change, fatigue, night sweats, lower back pain HEENT: Denies blurred vision, double vision, hearing loss, tinnitus, sinus congestion, rhinorrhea, sore throat, neck stiffness, dysphagia PULM: Denies shortness of breath, cough, sputum production, hemoptysis, wheezing CV: intermittent chest pain, edema, orthopnea, paroxysmal nocturnal dyspnea, palpitations GI: Denies abdominal pain, nausea, vomiting, diarrhea, hematochezia, melena, constipation, change in bowel habits GU: Denies dysuria, hematuria, polyuria, oliguria, urethral discharge Endocrine: Denies hot or cold intolerance, polyuria, polyphagia or appetite change Derm: Denies rash, dry skin, scaling or peeling skin change Heme: Denies easy bruising, bleeding, bleeding gums Neuro: Denies headache, numbness, weakness, slurred speech, loss of memory or consciousness  Past Medical History:  She,  has a past medical history of Albuminuria, Asthma, Atypical chest pain, Cancer ([place]), Chronic pain, Depression, Diabetes mellitus without  complication ([place]), Dysthymic disorder, Gouty arthropathy, Hyperlipidemia, Hypertension, Hypertensive emergency, Hyposomnia, insomnia, or sleeplessness associated with conditioned arousal, Lumbago, Morbid obesity ([place]), and Urinary incontinence.   Surgical History:   Past Surgical History:  Procedure Laterality Date   COLONOSCOPY W/ BIOPSIES     ESOPHAGOGASTRODUODENOSCOPY       Social History:   reports that she does not currently use alcohol. She reports that she does not currently use drugs.   Family History:  Her family history is not on file.   Allergies Allergies  Allergen Reactions   Keflex [Cephalexin]    Latex    Morphine And Related    Nafcillin Sodium In Dextrose    Sulfa Antibiotics      Home Medications  Prior to Admission medications   Not on File     Critical care time: 35 minutes    Darel Hong, AGACNP-BC [facility] Prefer epic messenger for cross cover needs If after hours, please call E-link

## 2021-09-14 NOTE — Progress Notes (Signed)
Inpatient Diabetes Program Recommendations  AACE/ADA: New Consensus Statement on Inpatient Glycemic Control   Target Ranges:  Prepandial:   less than 140 mg/dL      Peak postprandial:   less than 180 mg/dL (1-2 hours)      Critically ill patients:  140 - 180 mg/dL   Results for Rebecca Campbell, Rebecca Campbell (MRN [id number]) as of 09/14/2021 12:39  Ref. Range 09/13/2021 07:51 09/13/2021 11:58 09/13/2021 15:34 09/13/2021 21:25 09/14/2021 08:33 09/14/2021 11:32  Glucose-Capillary Latest Ref Range: 70 - 99 mg/dL 183 (H) 201 (H) 237 (H) 251 (H) 229 (H) 279 (H)   Review of Glycemic Control  Diabetes history: DM2 Outpatient Diabetes medications: 70/30 70 units QAM, Regular 45 units QHS, Jardiance 10 mg daily, Trulicity 3 mg Qweek Current orders for Inpatient glycemic control: Semglee 10 units QHS, Novolog 0-15 units TID with meals, Novolog 0-5 units QHS  Inpatient Diabetes Program Recommendations:    Insulin: Please consider increasing Semglee to 12 units QHS and ordering Novolog 4 units TID with meals for meal coverage if patient eats at least 50% of meals.  Thanks, Barnie Alderman, RN, MSN, CDE Diabetes Coordinator Inpatient Diabetes Program [phone] (Team Pager from 8am to 5pm)

## 2021-09-14 NOTE — Consult Note (Signed)
PHARMACY CONSULT NOTE  Pharmacy Consult for Electrolyte Monitoring and Replacement   Recent Labs: Potassium (mmol/L)  Date Value  09/14/2021 4.2   Magnesium (mg/dL)  Date Value  09/14/2021 2.1   Calcium (mg/dL)  Date Value  09/14/2021 8.3 (L)   Albumin (g/dL)  Date Value  09/12/2021 3.5   Phosphorus (mg/dL)  Date Value  09/14/2021 5.5 (H)   Sodium (mmol/L)  Date Value  09/14/2021 142   Assessment: Patient is a 50 y/o F with medical history including metastatic colon cancer with lung metastases, diabetes, CAD, HTN, HLD, morbid obesity, asthma, CKD, HFpEF, hepatic steatosis, gout, PTSD who is admitted with acute on chronic respiratory failure. Pharmacy consulted to assist with electrolyte monitoring and replacement as indicated.   Diuretics: Intermittent IV Lasix + Aldactone  Scr trending up (2.21 >> 2.73) with re-initiation of losartan yesterday & diuresis. Continue to monitor  Goal of Therapy:  Electrolyte within normal limits  Plan:  --Hypernatremia resolved --K 4.2, continue Kcl 10 mEq PO daily --No electrolyte replacement warranted at this time --Will follow-up electrolytes with AM labs tomorrow  Benita Gutter 09/14/2021 7:37 AM

## 2021-09-15 DIAGNOSIS — N179 Acute kidney failure, unspecified: Secondary | ICD-10-CM

## 2021-09-15 DIAGNOSIS — N189 Chronic kidney disease, unspecified: Secondary | ICD-10-CM

## 2021-09-15 DIAGNOSIS — J189 Pneumonia, unspecified organism: Secondary | ICD-10-CM

## 2021-09-15 DIAGNOSIS — J9602 Acute respiratory failure with hypercapnia: Secondary | ICD-10-CM

## 2021-09-15 LAB — CBC
HCT: 23.1 % — ABNORMAL LOW (ref 36.0–46.0)
Hemoglobin: 7.3 g/dL — ABNORMAL LOW (ref 12.0–15.0)
MCH: 30.7 pg (ref 26.0–34.0)
MCHC: 31.6 g/dL (ref 30.0–36.0)
MCV: 97.1 fL (ref 80.0–100.0)
Platelets: 96 10*3/uL — ABNORMAL LOW (ref 150–400)
RBC: 2.38 MIL/uL — ABNORMAL LOW (ref 3.87–5.11)
RDW: 15.8 % — ABNORMAL HIGH (ref 11.5–15.5)
WBC: 6.5 10*3/uL (ref 4.0–10.5)
nRBC: 0 % (ref 0.0–0.2)

## 2021-09-15 LAB — BASIC METABOLIC PANEL
Anion gap: 7 (ref 5–15)
BUN: 71 mg/dL — ABNORMAL HIGH (ref 6–20)
CO2: 29 mmol/L (ref 22–32)
Calcium: 8.1 mg/dL — ABNORMAL LOW (ref 8.9–10.3)
Chloride: 107 mmol/L (ref 98–111)
Creatinine, Ser: 2.89 mg/dL — ABNORMAL HIGH (ref 0.44–1.00)
GFR, Estimated: 19 mL/min — ABNORMAL LOW (ref >60)
Glucose, Bld: 243 mg/dL — ABNORMAL HIGH (ref 70–99)
Potassium: 3.9 mmol/L (ref 3.5–5.1)
Sodium: 143 mmol/L (ref 135–145)

## 2021-09-15 LAB — GLUCOSE, CAPILLARY
Glucose-Capillary: 217 mg/dL — ABNORMAL HIGH (ref 70–99)
Glucose-Capillary: 209 mg/dL — ABNORMAL HIGH (ref 70–99)
Glucose-Capillary: 297 mg/dL — ABNORMAL HIGH (ref 70–99)
Glucose-Capillary: 289 mg/dL — ABNORMAL HIGH (ref 70–99)
Glucose-Capillary: 292 mg/dL — ABNORMAL HIGH (ref 70–99)

## 2021-09-15 LAB — PROCALCITONIN: Procalcitonin: 1.81 ng/mL

## 2021-09-15 MED ORDER — DOCUSATE SODIUM 100 MG PO CAPS
100.0000 mg | ORAL_CAPSULE | Freq: Two times a day (BID) | ORAL | Status: DC
  Administered 2021-09-15 – 2021-09-18 (×3): 100 mg via ORAL
  Filled 2021-09-15 (×6): qty 1

## 2021-09-15 MED ORDER — BISACODYL 5 MG PO TBEC
10.0000 mg | DELAYED_RELEASE_TABLET | Freq: Every day | ORAL | Status: DC
  Administered 2021-09-15 – 2021-09-18 (×2): 10 mg via ORAL
  Filled 2021-09-15 (×3): qty 2

## 2021-09-15 NOTE — Evaluation (Signed)
Physical Therapy Evaluation Patient Details Name: Rebecca Campbell MRN: [id number] DOB: [birth year] Today's Date: 09/15/2021  History of Present Illness  50 y/o F with medical history including metastatic colon cancer with lung metastases, diabetes, CAD, HTN, HLD, morbid obesity, asthma, CKD, HFpEF, hepatic steatosis, gout, PTSD who is admitted with acute on chronic respiratory failure.  Clinical Impression  Pt did relatively well with bed mobility, transfers and short bout of ambulation today.  On arrival she was on 5L O2 with sats staying at 100%, did drop down to her baseline 3L for mobility and ambulation and she seemed to stay in the mid 90s most of the time with occasional drops to ~90 but no excessive SOB or DOE.  She reports minimal out of home mobility and thought she certainly feels weaker than her baseline she generally showed ability to manage in the home safely.       Recommendations for follow up therapy are one component of a multi-disciplinary discharge planning process, led by the attending physician.  Recommendations may be updated based on patient status, additional functional criteria and insurance authorization.  Follow Up Recommendations   HHPT, pt pt progress   Equipment Recommendations     none  Recommendations for Other Services       Precautions / Restrictions Precautions Precautions: Fall Restrictions Weight Bearing Restrictions: No      Mobility  Bed Mobility Overal bed mobility: Needs Assistance Bed Mobility: Supine to Sit     Supine to sit: Min assist     General bed mobility comments: Pt did vast majority of the effort, did need repeated encouragement and light assist for hand to pull up from    Transfers Overall transfer level: Needs assistance Equipment used: Rolling walker (2 wheeled) Transfers: Sit to/from Stand Sit to Stand: Supervision Stand pivot transfers: Min guard       General transfer comment: Again plenty of cuing/encouragement  but pt able to rise to standing w/o direct assist.  Pt very kyphotic, leaning forward and youth walker.  Ambulation/Gait Ambulation/Gait assistance: Min guard Gait Distance (Feet): 40 Feet Assistive device: Rolling walker (2 wheeled)       General Gait Details: Pt with slow, stooped, labored gait, but was able to do 2 small loops in the room on 3L with O2 remaining in the 90s and despite c/o fatigue she was safe and relatively stable during the effort.  Stairs            Wheelchair Mobility    Modified Rankin (Stroke Patients Only)       Balance Overall balance assessment: Needs assistance Sitting-balance support: Feet supported Sitting balance-Leahy Scale: Good     Standing balance support: Bilateral upper extremity supported Standing balance-Leahy Scale: Fair                               Pertinent Vitals/Pain Pain Assessment: 0-10 Pain Score: 7  Pain Location: chronic back, mild chest pain Pain Descriptors / Indicators: Aching;Discomfort Pain Intervention(s): Limited activity within patient's tolerance;Monitored during session;Repositioned;Patient requesting pain meds-RN notified    Home Living Family/patient expects to be discharged to:: Private residence Living Arrangements: Spouse/significant other Available Help at Discharge: Available 24 hours/day;Family Type of Home: House Home Access: Ramped entrance     Home Layout: One level Home Equipment: Bedside commode;Tub bench;Wheelchair - Education administrator (comment);Walker - 4 wheels      Prior Function Level of Independence: Independent with assistive device(s)  Comments: Pt reports being mod I with use of rollator for functional mobility and self care. Husband performs IADL tasks and assists her when "having a bad day".     Hand Dominance   Dominant Hand: Right    Extremity/Trunk Assessment   Upper Extremity Assessment Upper Extremity Assessment: Generalized weakness    Lower  Extremity Assessment Lower Extremity Assessment: Generalized weakness    Cervical / Trunk Assessment Cervical / Trunk Assessment: Kyphotic  Communication   Communication: No difficulties  Cognition Arousal/Alertness: Awake/alert Behavior During Therapy: WFL for tasks assessed/performed Overall Cognitive Status: Within Functional Limits for tasks assessed                                 General Comments: Pt is pleasant. A & O x4.      General Comments General comments (skin integrity, edema, etc.): Pt initially seemed hesitant to do a lot, but participate well with the session and did relatively well    Exercises     Assessment/Plan    PT Assessment Patient needs continued PT services  PT Problem List Decreased strength;Decreased range of motion;Decreased activity tolerance;Decreased balance;Decreased mobility;Decreased knowledge of use of DME;Decreased safety awareness;Pain;Cardiopulmonary status limiting activity       PT Treatment Interventions DME instruction;Gait training;Functional mobility training;Therapeutic activities;Therapeutic exercise;Balance training;Neuromuscular re-education;Patient/family education    PT Goals (Current goals can be found in the Care Plan section)  Acute Rehab PT Goals Patient Stated Goal: to return home PT Goal Formulation: With patient Time For Goal Achievement: 09/29/21 Potential to Achieve Goals: Good    Frequency Min 2X/week   Barriers to discharge        Co-evaluation               AM-PAC PT "6 Clicks" Mobility  Outcome Measure Help needed turning from your back to your side while in a flat bed without using bedrails?: A Little Help needed moving from lying on your back to sitting on the side of a flat bed without using bedrails?: A Little Help needed moving to and from a bed to a chair (including a wheelchair)?: A Little Help needed standing up from a chair using your arms (e.g., wheelchair or bedside  chair)?: A Little Help needed to walk in hospital room?: A Little Help needed climbing 3-5 steps with a railing? : A Lot 6 Click Score: 17    End of Session Equipment Utilized During Treatment: Gait belt;Oxygen Activity Tolerance: Patient tolerated treatment well;Patient limited by fatigue Patient left: in chair;with call bell/phone within reach;with family/visitor present Nurse Communication: Mobility status PT Visit Diagnosis: Muscle weakness (generalized) (M62.81);Unsteadiness on feet (R26.81);Difficulty in walking, not elsewhere classified (R26.2)    Time: 9977-4142 PT Time Calculation (min) (ACUTE ONLY): 24 min   Charges:   PT Evaluation $PT Eval Low Complexity: 1 Low PT Treatments $Gait Training: 8-22 mins        Kreg Shropshire, DPT 09/15/2021, 5:56 PM

## 2021-09-15 NOTE — Evaluation (Signed)
Occupational Therapy Evaluation Patient Details Name: Rebecca Campbell MRN: [id number] DOB: [birth year] Today's Date: 09/15/2021   History of Present Illness 50 y/o F with medical history including metastatic colon cancer with lung metastases, diabetes, CAD, HTN, HLD, morbid obesity, asthma, CKD, HFpEF, hepatic steatosis, gout, PTSD who is admitted with acute on chronic respiratory failure.   Clinical Impression   Patient presenting with decreased Ind in self care, balance, functional mobility/transfers, endurance, and safety awareness. Patient reports living at home with husband. Pt reports use of rollator for functional mobility. Pt short distance ambulation bed <>bathroom<>kitchen only. Use of wheelchair for appointments/longer distance. Pt on 5 Ls O2 at baseline. Patient currently functioning at min guard but with increasing back pain in recliner chair. RN notified. Patient will benefit from acute OT to increase overall independence in the areas of ADLs, functional mobility, and safety awareness in order to safely discharge to home with family.      Recommendations for follow up therapy are one component of a multi-disciplinary discharge planning process, led by the attending physician.  Recommendations may be updated based on patient status, additional functional criteria and insurance authorization.   Follow Up Recommendations  No OT follow up;Supervision - Intermittent    Equipment Recommendations  None recommended by OT       Precautions / Restrictions Precautions Precautions: Fall Restrictions Weight Bearing Restrictions: No      Mobility Bed Mobility               General bed mobility comments: seated in recliner chair    Transfers Overall transfer level: Needs assistance Equipment used: 1 person hand held assist Transfers: Sit to/from Stand Sit to Stand: Supervision Stand pivot transfers: Min guard            Balance Overall balance assessment: Needs  assistance Sitting-balance support: Feet supported Sitting balance-Leahy Scale: Good     Standing balance support: During functional activity Standing balance-Leahy Scale: Fair                             ADL either performed or assessed with clinical judgement   ADL Overall ADL's : Needs assistance/impaired                                       General ADL Comments: min A for LB self care. UB self care with set up A. Functional transfers with min guard.     Vision Patient Visual Report: No change from baseline              Pertinent Vitals/Pain Pain Assessment: 0-10 Pain Score: 7  Pain Location: back Pain Descriptors / Indicators: Aching;Discomfort Pain Intervention(s): Limited activity within patient's tolerance;Monitored during session;Repositioned;Patient requesting pain meds-RN notified     Hand Dominance Right   Extremity/Trunk Assessment Upper Extremity Assessment Upper Extremity Assessment: Generalized weakness   Lower Extremity Assessment Lower Extremity Assessment: Generalized weakness   Cervical / Trunk Assessment Cervical / Trunk Assessment: Kyphotic (forward neck and scoliosis)   Communication Communication Communication: No difficulties   Cognition Arousal/Alertness: Awake/alert Behavior During Therapy: WFL for tasks assessed/performed Overall Cognitive Status: Within Functional Limits for tasks assessed                                 General Comments: Pt  is pleasant. A & O x4.              Home Living Family/patient expects to be discharged to:: Private residence Living Arrangements: Spouse/significant other Available Help at Discharge: Available 24 hours/day;Family Type of Home: House       Home Layout: One level     Bathroom Shower/Tub: Tub/shower unit         Home Equipment: Bedside commode;Tub bench;Wheelchair - Education administrator (comment) (rollator)          Prior  Functioning/Environment Level of Independence: Independent with assistive device(s)        Comments: Pt reports being mod I with use of rollator for functional mobility and self care. Husband performs IADL tasks and assists her when "having a bad day".        OT Problem List: Decreased strength;Decreased activity tolerance;Impaired balance (sitting and/or standing);Decreased safety awareness;Decreased knowledge of use of DME or AE;Pain      OT Treatment/Interventions: Self-care/ADL training;Therapeutic exercise;Therapeutic activities;Energy conservation;DME and/or AE instruction;Patient/family education;Balance training    OT Goals(Current goals can be found in the care plan section) Acute Rehab OT Goals Patient Stated Goal: to return home OT Goal Formulation: With patient Time For Goal Achievement: 09/29/21 Potential to Achieve Goals: Fair  OT Frequency: Min 2X/week   Barriers to D/C:    none known at this time          AM-PAC OT "6 Clicks" Daily Activity     Outcome Measure Help from another person eating meals?: None Help from another person taking care of personal grooming?: A Little Help from another person toileting, which includes using toliet, bedpan, or urinal?: A Little Help from another person bathing (including washing, rinsing, drying)?: A Little Help from another person to put on and taking off regular upper body clothing?: None Help from another person to put on and taking off regular lower body clothing?: A Little 6 Click Score: 20   End of Session Nurse Communication: Mobility status  Activity Tolerance: Patient tolerated treatment well Patient left: in bed;with call bell/phone within reach  OT Visit Diagnosis: Unsteadiness on feet (R26.81);Muscle weakness (generalized) (M62.81);History of falling (Z91.81)                Time: 3267-1245 OT Time Calculation (min): 21 min Charges:  OT General Charges $OT Visit: 1 Visit OT Evaluation $OT Eval Low  Complexity: 1 Low OT Treatments $Self Care/Home Management : 8-22 mins Darleen Crocker, MS, OTR/L , CBIS ascom [phone]  09/15/21, 4:53 PM

## 2021-09-15 NOTE — Progress Notes (Signed)
A&O patient rested well this shift, tolerated bipap most of the shift. Pt did c/o lower back pain which improved with PRN medications. Pt voiding via pure wick with 300 ml of clear yellow urine out this shfit. Vital signs WNL  NSR on cardiac monitor. Hgb is 7.3

## 2021-09-15 NOTE — Progress Notes (Addendum)
PROGRESS NOTE    Rebecca Campbell  BTD:[id number] DOB: April 24, 1971 DOA: 09/12/2021 PCP: Pcp, No    Assessment & Plan:   Principal Problem:   Acute respiratory failure ([place]) Active Problems:   Acute on chronic diastolic (congestive) heart failure ([facility])   Chronic pain   Anxiety   Depression   Acute kidney injury superimposed on chronic kidney disease ([place])  Acute on chronic combined hypoxic & hypercapnic respiratory failure: secondary to pulmonary edema in the setting of hypertensive urgency, restrictive lung disease & possible CAP. Hx of colon cancer w/ mets to lungs w/ recent chemo, COVID19, asthma. Continue on supplemental oxygen and wean back to baseline as tolerated. Continue on IV rocephin, bronchodilators and encourage incentive spirometry.   Possible CAP: continue on IV rocephin, bronchodilators and supplemental oxygen. Encourage incentive spirometry   HTN urgency: resolved  Acute on chronic diastolic CHF exacerbation suspect secondary to hypertensive urgency . Continue on coreg  Elevated troponin secondary to suspected demand ischemia  AKI on CKDIIIb: baseline Cr 2.0. Cr is trending up from day prior. Avoid nephrotoxic meds   Normocytic anemia: possibly secondary to recent chemo. Will transfuse if Hb <7.0. Will continue to monitor    Thrombocytopenia: likely secondary to recent chemo. 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 Continue on home dose of pregabalin, oxycodone, flexeril   Depression: severity unknown. Continue on home dose of duloxetine   Anxiety: severity unknown. Ativan prn    DM2: poorly controlled w/ HbA1c 7.5. Continue on glargine, SSI w/ accuchecks    Colon cancer: w/ metastasis to the lung: management per onco at [facility]. Last chemo 09/10/21  Morbid obesity: BMI 48.2. Complicates overall care & prognosis     DVT prophylaxis: heparin  Code Status: full  Family Communication: discussed pt's care w/ pt's family at bedside  and answered their questions Disposition Plan: depends on PT/OT recs  Status is: Inpatient  Remains inpatient appropriate because:Unsafe d/c plan, IV treatments appropriate due to intensity of illness or inability to take PO, and Inpatient level of care appropriate due to severity of illness  Dispo: The patient is from: Home              Anticipated d/c is to: unclear              Patient currently is not medically stable to d/c.   Difficult to place patient : unclear     Level of care: Stepdown Consultants:    Procedures:  Antimicrobials: rocephin   Subjective: Pt c/o malaise   Objective: Vitals:   09/15/21 0400 09/15/21 0500 09/15/21 0600 09/15/21 0700  BP: 105/60  (!) 108/58   Pulse: 69  69 71  Resp:      Temp:      TempSrc:      SpO2: 100%  100% 100%  Weight:  115.8 kg    Height:        Intake/Output Summary (Last 24 hours) at 09/15/2021 0900 Last data filed at 09/15/2021 0200 Gross per 24 hour  Intake 907.19 ml  Output 1300 ml  Net -392.81 ml   Filed Weights   09/13/21 0006 09/15/21 0500  Weight: 113.4 kg 115.8 kg    Examination:  General exam: Appears calm and comfortable. Morbidly obese Respiratory system: diminished breath sounds b/l  Cardiovascular system: S1 & S2+. No rubs, gallops or clicks.  Gastrointestinal system: Abdomen is obese soft and nontender.  Normal bowel sounds heard. Central nervous  system: Alert and oriented. Moves all extremities  Psychiatry: Judgement and insight appear normal. Flat mood and affect    Data Reviewed: I have personally reviewed following labs and imaging studies  CBC: Recent Labs  Lab 09/12/21 1936 09/13/21 0420 09/14/21 0419 09/15/21 0522  WBC 14.6* 9.8 6.0 6.5  NEUTROABS 11.0*  --  4.2  --   HGB 10.8* 10.3* 7.5* 7.3*  HCT 33.0* 32.6* 23.7* 23.1*  MCV 93.5 92.6 94.4 97.1  PLT 174 139* 96* 96*   Basic Metabolic Panel: Recent Labs  Lab 09/12/21 1936 09/13/21 0420 09/14/21 0419 09/15/21 0522   NA 144 146* 142 143  K 4.0 4.2 4.2 3.9  CL 109 110 108 107  CO2 29 26 25 29   GLUCOSE 245* 189* 272* 243*  BUN 46* 50* 58* 71*  CREATININE 2.35* 2.21* 2.73* 2.89*  CALCIUM 8.8* 8.9 8.3* 8.1*  MG  --  2.1 2.1  --   PHOS  --  4.4 5.5*  --    GFR: Estimated Creatinine Clearance: 27 mL/min (A) (by C-G formula based on SCr of 2.89 mg/dL (H)). Liver Function Tests: Recent Labs  Lab 09/12/21 1936  AST 19  ALT 18  ALKPHOS 84  BILITOT 0.8  PROT 7.2  ALBUMIN 3.5   No results for input(s): LIPASE, AMYLASE in the last 168 hours. No results for input(s): AMMONIA in the last 168 hours. Coagulation Profile: Recent Labs  Lab 09/12/21 1936  INR 1.1   Cardiac Enzymes: No results for input(s): CKTOTAL, CKMB, CKMBINDEX, TROPONINI in the last 168 hours. BNP (last 3 results) No results for input(s): PROBNP in the last 8760 hours. HbA1C: Recent Labs    09/13/21 0420  HGBA1C 7.5*   CBG: Recent Labs  Lab 09/14/21 0833 09/14/21 1132 09/14/21 1607 09/14/21 2125 09/15/21 0810  GLUCAP 229* 279* 215* 259* 217*   Lipid Profile: No results for input(s): CHOL, HDL, LDLCALC, TRIG, CHOLHDL, LDLDIRECT in the last 72 hours. Thyroid Function Tests: No results for input(s): TSH, T4TOTAL, FREET4, T3FREE, THYROIDAB in the last 72 hours. Anemia Panel: No results for input(s): VITAMINB12, FOLATE, FERRITIN, TIBC, IRON, RETICCTPCT in the last 72 hours. Sepsis Labs: Recent Labs  Lab 09/12/21 1936 09/12/21 1937 09/13/21 0420 09/14/21 0419 09/15/21 0522  PROCALCITON <0.10  --  1.62 2.82 1.81  LATICACIDVEN  --  1.6 0.8  --   --     Recent Results (from the past 240 hour(s))  Resp Panel by RT-PCR (Flu A&B, Covid) Nasopharyngeal Swab     Status: None   Collection Time: 09/12/21  9:32 PM   Specimen: Nasopharyngeal Swab; Nasopharyngeal(NP) swabs in vial transport medium  Result Value Ref Range Status   SARS Coronavirus 2 by RT PCR NEGATIVE NEGATIVE Final    Comment: (NOTE) SARS-CoV-2 target  nucleic acids are NOT DETECTED.  The SARS-CoV-2 RNA is generally detectable in upper respiratory specimens during the acute phase of infection. The lowest concentration of SARS-CoV-2 viral copies this assay can detect is 138 copies/mL. A negative result does not preclude SARS-Cov-2 infection and should not be used as the sole basis for treatment or other patient management decisions. A negative result may occur with  improper specimen collection/handling, submission of specimen other than nasopharyngeal swab, presence of viral mutation(s) within the areas targeted by this assay, and inadequate number of viral copies(<138 copies/mL). A negative result must be combined with clinical observations, patient history, and epidemiological information. The expected result is Negative.  Fact Sheet for Patients:  EntrepreneurPulse.com.au  Fact Sheet for Healthcare Providers:  IncredibleEmployment.be  This test is no t yet approved or cleared by the [place] FDA and  has been authorized for detection and/or diagnosis of SARS-CoV-2 by FDA under an Emergency Use Authorization (EUA). This EUA will remain  in effect (meaning this test can be used) for the duration of the COVID-19 declaration under Section 564(b)(1) of the Act, 21 U.S.C.section 360bbb-3(b)(1), unless the authorization is terminated  or revoked sooner.       Influenza A by PCR NEGATIVE NEGATIVE Final   Influenza B by PCR NEGATIVE NEGATIVE Final    Comment: (NOTE) The Xpert Xpress SARS-CoV-2/FLU/RSV plus assay is intended as an aid in the diagnosis of influenza from Nasopharyngeal swab specimens and should not be used as a sole basis for treatment. Nasal washings and aspirates are unacceptable for Xpert Xpress SARS-CoV-2/FLU/RSV testing.  Fact Sheet for Patients: EntrepreneurPulse.com.au  Fact Sheet for Healthcare  Providers: IncredibleEmployment.be  This test is not yet approved or cleared by the [place] FDA and has been authorized for detection and/or diagnosis of SARS-CoV-2 by FDA under an Emergency Use Authorization (EUA). This EUA will remain in effect (meaning this test can be used) for the duration of the COVID-19 declaration under Section 564(b)(1) of the Act, 21 U.S.C. section 360bbb-3(b)(1), unless the authorization is terminated or revoked.  Performed at [facility], [address] [place], [place] [address]   MRSA Next Gen by PCR, Nasal     Status: None   Collection Time: 09/13/21  2:59 AM   Specimen: Nasal Mucosa; Nasal Swab  Result Value Ref Range Status   MRSA by PCR Next Gen NOT DETECTED NOT DETECTED Final    Comment: (NOTE) The GeneXpert MRSA Assay (FDA approved for NASAL specimens only), is one component of a comprehensive MRSA colonization surveillance program. It is not intended to diagnose MRSA infection nor to guide or monitor treatment for MRSA infections. Test performance is not FDA approved in patients less than 66 years old. Performed at [facility], [address] [place], [place] [address]   Culture, blood (single) w Reflex to ID Panel     Status: None (Preliminary result)   Collection Time: 09/13/21  4:20 AM   Specimen: BLOOD  Result Value Ref Range Status   Specimen Description BLOOD LEFT [facility]  Final   Special Requests   Final    BOTTLES DRAWN AEROBIC AND ANAEROBIC Blood Culture adequate volume   Culture   Final    NO GROWTH 2 DAYS Performed at [facility], [address] [place], [place] [address]    Report Status PENDING  Incomplete         Radiology Studies: ECHOCARDIOGRAM COMPLETE  Result Date: 09/14/2021    ECHOCARDIOGRAM REPORT   Patient Name:   KATRIEL CUTSFORTH Date of Exam: 09/14/2021 Medical Rec #:  [id number]       Height:       61.0 in Accession #:    [id number]      Weight:        250.0 lb Date of Birth:  July 26, 1971        BSA:          2.077 m Patient Age:    50 years        BP:           154/83 mmHg Patient Gender: F               HR:  88 bpm. Exam Location:  [facility] Procedure: 2D Echo, Cardiac Doppler and Color Doppler Indications:     cardiomyopathy -unspecified I42.9  History:         Patient has no prior history of Echocardiogram examinations.                  Risk Factors:Hypertension and Diabetes. Atypical chest pain.  Sonographer:     Sherrie Sport Referring Phys:  [id number] BRITTON L RUST-CHESTER Diagnosing Phys: Yolonda Kida MD  Sonographer Comments: No apical window, no subcostal window and Technically challenging study due to limited acoustic windows. Thse only view obtainable was parasternal. IMPRESSIONS  1. Left ventricular ejection fraction, by estimation, is 70 to 75%. The left ventricle has hyperdynamic function. The left ventricle has no regional wall motion abnormalities. There is moderate concentric left ventricular hypertrophy. Left ventricular diastolic function could not be evaluated.  2. Right ventricular systolic function is normal. The right ventricular size is normal.  3. The mitral valve is normal in structure. Trivial mitral valve regurgitation.  4. The aortic valve is normal in structure. Aortic valve regurgitation is not visualized. FINDINGS  Left Ventricle: Left ventricular ejection fraction, by estimation, is 70 to 75%. The left ventricle has hyperdynamic function. The left ventricle has no regional wall motion abnormalities. The left ventricular internal cavity size was normal in size. There is moderate concentric left ventricular hypertrophy. Left ventricular diastolic function could not be evaluated. Right Ventricle: The right ventricular size is normal. No increase in right ventricular wall thickness. Right ventricular systolic function is normal. Left Atrium: Left atrial size was normal in size. Right Atrium: Right atrial size was normal in size.  Pericardium: There is no evidence of pericardial effusion. Mitral Valve: The mitral valve is normal in structure. Trivial mitral valve regurgitation. Tricuspid Valve: The tricuspid valve is normal in structure. Tricuspid valve regurgitation is trivial. Aortic Valve: The aortic valve is normal in structure. Aortic valve regurgitation is not visualized. Pulmonic Valve: The pulmonic valve was normal in structure. Pulmonic valve regurgitation is not visualized. Aorta: The ascending aorta was not well visualized. IAS/Shunts: No atrial level shunt detected by color flow Doppler.  LEFT VENTRICLE PLAX 2D LVIDd:         4.40 cm LVIDs:         2.60 cm LV PW:         1.70 cm LV IVS:        1.60 cm LVOT diam:     2.00 cm LVOT Area:     3.14 cm  LEFT ATRIUM         Index LA diam:    5.20 cm 2.50 cm/m                        PULMONIC VALVE AORTA                 PV Vmax:        1.02 m/s Ao Root diam: 3.40 cm PV Peak grad:   4.2 mmHg                       RVOT Peak grad: 6 mmHg   SHUNTS Systemic Diam: 2.00 cm Yolonda Kida MD Electronically signed by Yolonda Kida MD Signature Date/Time: 09/14/2021/9:34:14 PM    Final         Scheduled Meds:  amLODipine  10 mg Oral Daily   aspirin EC  81 mg  Oral Daily   atorvastatin  10 mg Oral Daily   carvedilol  25 mg Oral BID   Chlorhexidine Gluconate Cloth  6 each Topical Daily   cloNIDine  0.1 mg Oral BID   DULoxetine  60 mg Oral BID   heparin  5,000 Units Subcutaneous Q8H   insulin aspart  0-15 Units Subcutaneous TID WC   insulin aspart  0-5 Units Subcutaneous QHS   insulin aspart  4 Units Subcutaneous TID WC   insulin glargine-yfgn  12 Units Subcutaneous QHS   isosorbide mononitrate  30 mg Oral Daily   OLANZapine  5 mg Oral QHS   pantoprazole  40 mg Oral Daily   polyethylene glycol  17 g Oral Daily   potassium chloride  10 mEq Oral Daily   pregabalin  150 mg Oral TID   Continuous Infusions:  cefTRIAXone (ROCEPHIN)  IV Stopped (09/14/21 2200)     LOS: 3  days    Time spent: 33 mins     Wyvonnia Dusky, MD [facility] Pager [phone]  If 7PM-7AM, please contact night-coverage 09/15/2021, 9:00 AM

## 2021-09-16 LAB — GLUCOSE, CAPILLARY
Glucose-Capillary: 170 mg/dL — ABNORMAL HIGH (ref 70–99)
Glucose-Capillary: 220 mg/dL — ABNORMAL HIGH (ref 70–99)
Glucose-Capillary: 281 mg/dL — ABNORMAL HIGH (ref 70–99)
Glucose-Capillary: 237 mg/dL — ABNORMAL HIGH (ref 70–99)

## 2021-09-16 LAB — CBC
HCT: 23.8 % — ABNORMAL LOW (ref 36.0–46.0)
Hemoglobin: 7.5 g/dL — ABNORMAL LOW (ref 12.0–15.0)
MCH: 29.6 pg (ref 26.0–34.0)
MCHC: 31.5 g/dL (ref 30.0–36.0)
MCV: 94.1 fL (ref 80.0–100.0)
Platelets: 92 10*3/uL — ABNORMAL LOW (ref 150–400)
RBC: 2.53 MIL/uL — ABNORMAL LOW (ref 3.87–5.11)
RDW: 15.5 % (ref 11.5–15.5)
WBC: 4.5 10*3/uL (ref 4.0–10.5)
nRBC: 0 % (ref 0.0–0.2)

## 2021-09-16 LAB — BASIC METABOLIC PANEL
Anion gap: 10 (ref 5–15)
BUN: 68 mg/dL — ABNORMAL HIGH (ref 6–20)
CO2: 27 mmol/L (ref 22–32)
Calcium: 8.2 mg/dL — ABNORMAL LOW (ref 8.9–10.3)
Chloride: 106 mmol/L (ref 98–111)
Creatinine, Ser: 2.73 mg/dL — ABNORMAL HIGH (ref 0.44–1.00)
GFR, Estimated: 20 mL/min — ABNORMAL LOW (ref >60)
Glucose, Bld: 203 mg/dL — ABNORMAL HIGH (ref 70–99)
Potassium: 4.2 mmol/L (ref 3.5–5.1)
Sodium: 143 mmol/L (ref 135–145)

## 2021-09-16 LAB — PROCALCITONIN: Procalcitonin: 1.04 ng/mL

## 2021-09-16 LAB — MAGNESIUM: Magnesium: 2.1 mg/dL (ref 1.7–2.4)

## 2021-09-16 NOTE — TOC Progression Note (Signed)
Transition of Care [facility]) - Progression Note    Patient Details  Name: Rebecca Campbell MRN: [id number] Date of Birth: July 13, 1971  Transition of Care [facility]) CM/SW Tyler Run, RN Phone Number: 09/16/2021, 4:15 PM  Clinical Narrative:  Attending request HHPT, called and discussed with husband who agrees with resuming HHS, previously with [facility]. [facility], [phone] spoke with Loralyn Freshwater who confirms that patient is still open with them and to fax order to [phone] when discharged.          Expected Discharge Plan and Services                                                 Social Determinants of Health (SDOH) Interventions    Readmission Risk Interventions No flowsheet data found.

## 2021-09-16 NOTE — Progress Notes (Signed)
Inpatient Diabetes Program Recommendations  AACE/ADA: New Consensus Statement on Inpatient Glycemic Control   Target Ranges:  Prepandial:   less than 140 mg/dL      Peak postprandial:   less than 180 mg/dL (1-2 hours)      Critically ill patients:  140 - 180 mg/dL   Results for RIM, THATCH (MRN [id number]) as of 09/16/2021 12:04  Ref. Range 09/15/2021 08:10 09/15/2021 11:35 09/15/2021 16:53 09/15/2021 21:12 09/15/2021 22:29 09/16/2021 07:58  Glucose-Capillary Latest Ref Range: 70 - 99 mg/dL 217 (H) 209 (H) 297 (H) 289 (H) 292 (H) 170 (H)    Review of Glycemic Control  Diabetes history: DM2 Outpatient Diabetes medications: 70/30 70 units QAM, Regular 45 units QHS, Jardiance 10 mg daily, Trulicity 3 mg Qweek Current orders for Inpatient glycemic control: Semglee 12 units QHS, Novolog 0-15 units TID with meals, Novolog 0-5 units QHS, Novolog 4 units TID with meals   Inpatient Diabetes Program Recommendations:     Insulin: Please consider increasing Semglee to 13 units QHS and meal coverage to Novolog 6 units TID with meals if patient eats at least 50% of meals.  Thanks, Barnie Alderman, RN, MSN, CDE Diabetes Coordinator Inpatient Diabetes Program [phone] (Team Pager from 8am to 5pm)

## 2021-09-16 NOTE — Progress Notes (Addendum)
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 Hx of colon cancer w/ mets to lungs w/ recent chemo, COVID19, asthma. Continue on supplemental oxygen and wean back to baseline as tolerated which is 2-3 L [employer]. Continue on IV rocephin, bronchodilators and encourage incentive spirometry.   Possible CAP: continue on IV rocephin, bronchodilators & supplemental oxygen. Encourage incentive spirometry   HTN urgency: resolved  Acute on chronic diastolic CHF exacerbation suspect secondary to hypertensive urgency . Continue on coreg  Elevated troponin secondary to suspected demand ischemia  AKI on CKDIIIb: baseline Cr 2.73. Cr is at baseline   Normocytic anemia: possibly secondary to recent chemo. Will transfuse if Hb <7.0. Will continue to monitor    Thrombocytopenia: likely secondary to recent chemo. 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 Continue on home dose of pregabalin, oxycodone, flexeril   Depression: severity unknown. Continue on home dose of duloxetine   Anxiety: severity unknown. Ativan prn    DM2: HbA1c 7.5, poorly controlled. Continue on glargine, SSI w/ accuchecks   Colon cancer: w/ mets to the lung. Management per onco at [facility]. Last chemo 09/10/21  Morbid obesity: BMI 48.2. Complicates overall care & prognosis      DVT prophylaxis: heparin  Code Status: full  Family Communication: discussed w/ pt's husband via phone and answered his questions.   Disposition Plan: likely d/c back home   Status is: Inpatient  Remains inpatient appropriate because:Unsafe d/c plan, IV treatments appropriate due to intensity of illness or inability to take PO, and Inpatient level of care appropriate due to severity of illness  Dispo: The patient is from: Home              Anticipated d/c is to: home w/ home health               Patient currently is not medically stable to d/c.   Difficult to place patient : unclear     Level of care: Stepdown Consultants:    Procedures:  Antimicrobials: rocephin   Subjective: Pt c/o fatigue   Objective: Vitals:   09/16/21 0200 09/16/21 0246 09/16/21 0400 09/16/21 0600  BP: (!) 96/58  (!) 93/56 129/65  Pulse: 76  78 86  Resp: (!) 0  (!) 22 (!) 21  Temp:  98.8 F (37.1 C)    TempSrc:  Oral    SpO2: 100%  100% 100%  Weight:      Height:        Intake/Output Summary (Last 24 hours) at 09/16/2021 0837 Last data filed at 09/15/2021 2239 Gross per 24 hour  Intake 1080 ml  Output 1450 ml  Net -370 ml   Filed Weights   09/13/21 0006 09/15/21 0500  Weight: 113.4 kg 115.8 kg    Examination:  General exam: Appears comfortable. Morbidly obese Respiratory system: decreased breath sounds b/l Cardiovascular system: S1/S2+. No rubs or clicks  Gastrointestinal system: Abd is soft, NT, obese & hypoactive bowel sounds  Central nervous  system: Awake and alert. Moves all extremities   Psychiatry: Judgement and insight appear normal. Flat mood and affect    Data Reviewed: I have personally reviewed following labs and imaging studies  CBC: Recent Labs  Lab 09/12/21 1936 09/13/21 0420 09/14/21 0419 09/15/21 0522 09/16/21 0529  WBC 14.6* 9.8 6.0 6.5 4.5  NEUTROABS 11.0*  --  4.2  --   --   HGB 10.8* 10.3* 7.5* 7.3* 7.5*  HCT 33.0* 32.6* 23.7* 23.1* 23.8*  MCV 93.5 92.6 94.4 97.1 94.1  PLT 174 139* 96* 96* 92*   Basic Metabolic Panel: Recent Labs  Lab 09/12/21 1936 09/13/21 0420  09/14/21 0419 09/15/21 0522 09/16/21 0529  NA 144 146* 142 143 143  K 4.0 4.2 4.2 3.9 4.2  CL 109 110 108 107 106  CO2 29 26 25 29 27   GLUCOSE 245* 189* 272* 243* 203*  BUN 46* 50* 58* 71* 68*  CREATININE 2.35* 2.21* 2.73* 2.89* 2.73*  CALCIUM 8.8* 8.9 8.3* 8.1* 8.2*  MG  --  2.1 2.1  --  2.1  PHOS  --  4.4 5.5*  --   --    GFR: Estimated Creatinine Clearance: 28.5 mL/min (A) (by C-G formula based on SCr of 2.73 mg/dL (H)). Liver Function Tests: Recent Labs  Lab 09/12/21 1936  AST 19  ALT 18  ALKPHOS 84  BILITOT 0.8  PROT 7.2  ALBUMIN 3.5   No results for input(s): LIPASE, AMYLASE in the last 168 hours. No results for input(s): AMMONIA in the last 168 hours. Coagulation Profile: Recent Labs  Lab 09/12/21 1936  INR 1.1   Cardiac Enzymes: No results for input(s): CKTOTAL, CKMB, CKMBINDEX, TROPONINI in the last 168 hours. BNP (last 3 results) No results for input(s): PROBNP in the last 8760 hours. HbA1C: No results for input(s): HGBA1C in the last 72 hours.  CBG: Recent Labs  Lab 09/15/21 1135 09/15/21 1653 09/15/21 2112 09/15/21 2229 09/16/21 0758  GLUCAP 209* 297* 289* 292* 170*   Lipid Profile: No results for input(s): CHOL, HDL, LDLCALC, TRIG, CHOLHDL, LDLDIRECT in the last 72 hours. Thyroid Function Tests: No results for input(s): TSH, T4TOTAL, FREET4, T3FREE, THYROIDAB in the last 72 hours. Anemia Panel: No results for input(s): VITAMINB12, FOLATE, FERRITIN, TIBC, IRON, RETICCTPCT in the last 72 hours. Sepsis Labs: Recent Labs  Lab 09/12/21 1937 09/13/21 0420 09/14/21 0419 09/15/21 0522 09/16/21 0529  PROCALCITON  --  1.62 2.82 1.81 1.04  LATICACIDVEN 1.6 0.8  --   --   --     Recent Results (from the past 240 hour(s))  Resp Panel by RT-PCR (Flu A&B, Covid) Nasopharyngeal Swab     Status: None   Collection Time: 09/12/21  9:32 PM   Specimen: Nasopharyngeal Swab; Nasopharyngeal(NP) swabs in vial transport medium  Result Value Ref Range  Status   SARS Coronavirus 2 by RT PCR NEGATIVE NEGATIVE Final    Comment: (NOTE) SARS-CoV-2 target nucleic acids are NOT DETECTED.  The SARS-CoV-2 RNA is generally detectable in upper respiratory specimens during the acute phase of infection. The lowest concentration of SARS-CoV-2 viral copies this assay can detect is 138 copies/mL. A negative result does not preclude SARS-Cov-2 infection and should not be used as the sole basis for treatment or other patient management decisions. A negative result may occur with  improper specimen collection/handling, submission of specimen other than nasopharyngeal swab, presence of viral mutation(s) within the areas targeted by this assay, and inadequate number of viral copies(<138 copies/mL). A negative  result must be combined with clinical observations, patient history, and epidemiological information. The expected result is Negative.  Fact Sheet for Patients:  EntrepreneurPulse.com.au  Fact Sheet for Healthcare Providers:  IncredibleEmployment.be  This test is no t yet approved or cleared by the [place] FDA and  has been authorized for detection and/or diagnosis of SARS-CoV-2 by FDA under an Emergency Use Authorization (EUA). This EUA will remain  in effect (meaning this test can be used) for the duration of the COVID-19 declaration under Section 564(b)(1) of the Act, 21 U.S.C.section 360bbb-3(b)(1), unless the authorization is terminated  or revoked sooner.       Influenza A by PCR NEGATIVE NEGATIVE Final   Influenza B by PCR NEGATIVE NEGATIVE Final    Comment: (NOTE) The Xpert Xpress SARS-CoV-2/FLU/RSV plus assay is intended as an aid in the diagnosis of influenza from Nasopharyngeal swab specimens and should not be used as a sole basis for treatment. Nasal washings and aspirates are unacceptable for Xpert Xpress SARS-CoV-2/FLU/RSV testing.  Fact Sheet for  Patients: EntrepreneurPulse.com.au  Fact Sheet for Healthcare Providers: IncredibleEmployment.be  This test is not yet approved or cleared by the [place] FDA and has been authorized for detection and/or diagnosis of SARS-CoV-2 by FDA under an Emergency Use Authorization (EUA). This EUA will remain in effect (meaning this test can be used) for the duration of the COVID-19 declaration under Section 564(b)(1) of the Act, 21 U.S.C. section 360bbb-3(b)(1), unless the authorization is terminated or revoked.  Performed at [facility], [address] [place], [place] [address]   MRSA Next Gen by PCR, Nasal     Status: None   Collection Time: 09/13/21  2:59 AM   Specimen: Nasal Mucosa; Nasal Swab  Result Value Ref Range Status   MRSA by PCR Next Gen NOT DETECTED NOT DETECTED Final    Comment: (NOTE) The GeneXpert MRSA Assay (FDA approved for NASAL specimens only), is one component of a comprehensive MRSA colonization surveillance program. It is not intended to diagnose MRSA infection nor to guide or monitor treatment for MRSA infections. Test performance is not FDA approved in patients less than 66 years old. Performed at [facility], [address] [place], [place] [address]   Culture, blood (single) w Reflex to ID Panel     Status: None (Preliminary result)   Collection Time: 09/13/21  4:20 AM   Specimen: BLOOD  Result Value Ref Range Status   Specimen Description BLOOD LEFT [facility]  Final   Special Requests   Final    BOTTLES DRAWN AEROBIC AND ANAEROBIC Blood Culture adequate volume   Culture   Final    NO GROWTH 3 DAYS Performed at [facility], [address] [place], [place] [address]    Report Status PENDING  Incomplete         Radiology Studies: ECHOCARDIOGRAM COMPLETE  Result Date: 09/14/2021    ECHOCARDIOGRAM REPORT   Patient Name:   ADALY PUDER Date of Exam: 09/14/2021 Medical Rec #:   [id number]       Height:       61.0 in Accession #:    [id number]      Weight:       250.0 lb Date of Birth:  Apr 29, 1971        BSA:          2.077 m Patient Age:    50 years        BP:  154/83 mmHg Patient Gender: F               HR:           88 bpm. Exam Location:  [facility] Procedure: 2D Echo, Cardiac Doppler and Color Doppler Indications:     cardiomyopathy -unspecified I42.9  History:         Patient has no prior history of Echocardiogram examinations.                  Risk Factors:Hypertension and Diabetes. Atypical chest pain.  Sonographer:     Sherrie Sport Referring Phys:  [id number] BRITTON L [place] Diagnosing Phys: Yolonda Kida MD  Sonographer Comments: No apical window, no subcostal window and Technically challenging study due to limited acoustic windows. Thse only view obtainable was parasternal. IMPRESSIONS  1. Left ventricular ejection fraction, by estimation, is 70 to 75%. The left ventricle has hyperdynamic function. The left ventricle has no regional wall motion abnormalities. There is moderate concentric left ventricular hypertrophy. Left ventricular diastolic function could not be evaluated.  2. Right ventricular systolic function is normal. The right ventricular size is normal.  3. The mitral valve is normal in structure. Trivial mitral valve regurgitation.  4. The aortic valve is normal in structure. Aortic valve regurgitation is not visualized. FINDINGS  Left Ventricle: Left ventricular ejection fraction, by estimation, is 70 to 75%. The left ventricle has hyperdynamic function. The left ventricle has no regional wall motion abnormalities. The left ventricular internal cavity size was normal in size. There is moderate concentric left ventricular hypertrophy. Left ventricular diastolic function could not be evaluated. Right Ventricle: The right ventricular size is normal. No increase in right ventricular wall thickness. Right ventricular systolic function is normal. Left Atrium: Left  atrial size was normal in size. Right Atrium: Right atrial size was normal in size. Pericardium: There is no evidence of pericardial effusion. Mitral Valve: The mitral valve is normal in structure. Trivial mitral valve regurgitation. Tricuspid Valve: The tricuspid valve is normal in structure. Tricuspid valve regurgitation is trivial. Aortic Valve: The aortic valve is normal in structure. Aortic valve regurgitation is not visualized. Pulmonic Valve: The pulmonic valve was normal in structure. Pulmonic valve regurgitation is not visualized. Aorta: The ascending aorta was not well visualized. IAS/Shunts: No atrial level shunt detected by color flow Doppler.  LEFT VENTRICLE PLAX 2D LVIDd:         4.40 cm LVIDs:         2.60 cm LV PW:         1.70 cm LV IVS:        1.60 cm LVOT diam:     2.00 cm LVOT Area:     3.14 cm  LEFT ATRIUM         Index LA diam:    5.20 cm 2.50 cm/m                        PULMONIC VALVE AORTA                 PV Vmax:        1.02 m/s Ao Root diam: 3.40 cm PV Peak grad:   4.2 mmHg                       RVOT Peak grad: 6 mmHg   SHUNTS Systemic Diam: 2.00 cm Yolonda Kida MD Electronically signed by Yolonda Kida MD Signature Date/Time:  09/14/2021/9:34:14 PM    Final         Scheduled Meds:  amLODipine  10 mg Oral Daily   aspirin EC  81 mg Oral Daily   atorvastatin  10 mg Oral Daily   bisacodyl  10 mg Oral Daily   carvedilol  25 mg Oral BID   Chlorhexidine Gluconate Cloth  6 each Topical Daily   cloNIDine  0.1 mg Oral BID   docusate sodium  100 mg Oral BID   DULoxetine  60 mg Oral BID   heparin  5,000 Units Subcutaneous Q8H   insulin aspart  0-15 Units Subcutaneous TID WC   insulin aspart  0-5 Units Subcutaneous QHS   insulin aspart  4 Units Subcutaneous TID WC   insulin glargine-yfgn  12 Units Subcutaneous QHS   isosorbide mononitrate  30 mg Oral Daily   OLANZapine  5 mg Oral QHS   pantoprazole  40 mg Oral Daily   polyethylene glycol  17 g Oral Daily   potassium  chloride  10 mEq Oral Daily   pregabalin  150 mg Oral TID   Continuous Infusions:  cefTRIAXone (ROCEPHIN)  IV 2 g (09/15/21 2250)     LOS: 4 days    Time spent: 30 mins     Wyvonnia Dusky, MD [facility] Pager [phone]  If 7PM-7AM, please contact night-coverage 09/16/2021, 8:37 AM

## 2021-09-17 DIAGNOSIS — G8929 Other chronic pain: Secondary | ICD-10-CM

## 2021-09-17 LAB — BASIC METABOLIC PANEL
Anion gap: 11 (ref 5–15)
BUN: 61 mg/dL — ABNORMAL HIGH (ref 6–20)
CO2: 26 mmol/L (ref 22–32)
Calcium: 8.5 mg/dL — ABNORMAL LOW (ref 8.9–10.3)
Chloride: 106 mmol/L (ref 98–111)
Creatinine, Ser: 2.52 mg/dL — ABNORMAL HIGH (ref 0.44–1.00)
GFR, Estimated: 22 mL/min — ABNORMAL LOW (ref >60)
Glucose, Bld: 223 mg/dL — ABNORMAL HIGH (ref 70–99)
Potassium: 4.7 mmol/L (ref 3.5–5.1)
Sodium: 143 mmol/L (ref 135–145)

## 2021-09-17 LAB — CBC
HCT: 24.2 % — ABNORMAL LOW (ref 36.0–46.0)
Hemoglobin: 7.7 g/dL — ABNORMAL LOW (ref 12.0–15.0)
MCH: 30.6 pg (ref 26.0–34.0)
MCHC: 31.8 g/dL (ref 30.0–36.0)
MCV: 96 fL (ref 80.0–100.0)
Platelets: 94 10*3/uL — ABNORMAL LOW (ref 150–400)
RBC: 2.52 MIL/uL — ABNORMAL LOW (ref 3.87–5.11)
RDW: 15.5 % (ref 11.5–15.5)
WBC: 5.1 10*3/uL (ref 4.0–10.5)
nRBC: 0 % (ref 0.0–0.2)

## 2021-09-17 LAB — GLUCOSE, CAPILLARY
Glucose-Capillary: 234 mg/dL — ABNORMAL HIGH (ref 70–99)
Glucose-Capillary: 322 mg/dL — ABNORMAL HIGH (ref 70–99)
Glucose-Capillary: 231 mg/dL — ABNORMAL HIGH (ref 70–99)
Glucose-Capillary: 269 mg/dL — ABNORMAL HIGH (ref 70–99)

## 2021-09-17 LAB — MAGNESIUM: Magnesium: 2.4 mg/dL (ref 1.7–2.4)

## 2021-09-17 NOTE — Progress Notes (Signed)
Inpatient Diabetes Program Recommendations  AACE/ADA: New Consensus Statement on Inpatient Glycemic Control (2015)  Target Ranges:  Prepandial:   less than 140 mg/dL      Peak postprandial:   less than 180 mg/dL (1-2 hours)      Critically ill patients:  140 - 180 mg/dL  Results for KAYE, LUOMA (MRN [id number]) as of 09/17/2021 08:50  Ref. Range 09/16/2021 07:58 09/16/2021 12:04 09/16/2021 15:47 09/16/2021 21:36  Glucose-Capillary Latest Ref Range: 70 - 99 mg/dL 170 (H)  7 units Novolog  220 (H)  9 units Novolog  281 (H)  12 units Novolog @1756  237 (H)  2 units Novolog  12 units Semglee  Results for WILLAMAE, DEMBY (MRN [id number]) as of 09/17/2021 08:50  Ref. Range 09/17/2021 08:11  Glucose-Capillary Latest Ref Range: 70 - 99 mg/dL 234 (H)    Home DM Meds: 70/30 Insulin 70 units QAM     Regular 45 units QHS?     Jardiance 10 mg daily     Trulicity 3 mg Qweek      Current Orders: Novolog 0-15 units ac/hs    Novolog 4 units TID with meals   Semglee 12 units QHS     MD- Note CBGs >200.  Please consider:  1. Increase Semglee to 15 units QHS  2. Increase Novolog Meal Coverage to 6 units TID with meals   --Will follow patient during hospitalization--  Wyn Quaker RN, MSN, CDE Diabetes Coordinator Inpatient Glycemic Control Team Team Pager: [phone] (8a-5p)

## 2021-09-17 NOTE — Plan of Care (Signed)
  Problem: Education: Goal: Knowledge of General Education information will improve Description: Including pain rating scale, medication(s)/side effects and non-pharmacologic comfort measures Outcome: Progressing   Problem: Health Behavior/Discharge Planning: Goal: Ability to manage health-related needs will improve Outcome: Progressing   Problem: Clinical Measurements: Goal: Ability to maintain clinical measurements within normal limits will improve Outcome: Progressing Goal: Will remain free from infection Outcome: Progressing Goal: Diagnostic test results will improve Outcome: Progressing Goal: Respiratory complications will improve Outcome: Progressing Goal: Cardiovascular complication will be avoided Outcome: Progressing   Problem: Activity: Goal: Risk for activity intolerance will decrease Outcome: Progressing   Problem: Nutrition: Goal: Adequate nutrition will be maintained Outcome: Progressing   Problem: Coping: Goal: Level of anxiety will decrease Outcome: Progressing   Problem: Elimination: Goal: Will not experience complications related to bowel motility Outcome: Progressing Goal: Will not experience complications related to urinary retention Outcome: Progressing   Problem: Pain Managment: Goal: General experience of comfort will improve Outcome: Progressing   Problem: Safety: Goal: Ability to remain free from injury will improve Outcome: Progressing   Problem: Skin Integrity: Goal: Risk for impaired skin integrity will decrease Outcome: Progressing   Problem: Education: Goal: Ability to describe self-care measures that may prevent or decrease complications (Diabetes Survival Skills Education) will improve Outcome: Progressing Goal: Individualized Educational Video(s) Outcome: Progressing   Problem: Fluid Volume: Goal: Ability to maintain a balanced intake and output will improve Outcome: Progressing   Problem: Nutritional: Goal: Maintenance of  adequate nutrition will improve Outcome: Progressing Goal: Progress toward achieving an optimal weight will improve Outcome: Progressing   Problem: Skin Integrity: Goal: Risk for impaired skin integrity will decrease Outcome: Progressing   Problem: Tissue Perfusion: Goal: Adequacy of tissue perfusion will improve Outcome: Progressing

## 2021-09-17 NOTE — Progress Notes (Signed)
Physical Therapy Treatment Patient Details Name: Rebecca Campbell MRN: [id number] DOB: 05-16-1971 Today's Date: 09/17/2021   History of Present Illness 50 y/o F with medical history including metastatic colon cancer with lung metastases, diabetes, CAD, HTN, HLD, morbid obesity, asthma, CKD, HFpEF, hepatic steatosis, gout, PTSD who is admitted with acute on chronic respiratory failure.    PT Comments    Unable to tolerate gait training/distance this date due to fatigue/SOB with minimal activity this session.  Very motivated and eager to participate progress (with goal of maintaining strength and indep as much as possible), but Fatigues quickly, unable to tolerate additional distance/activity as result.  Sats 88% on 2L with exertion, recovering >90% within 30 seconds of seated rest  Unable to demonstrate ability to negotiate household distances this date, requiring hands-on, physical assist for all mobility tasks. Discharge recommendations updated to reflect change in performance; MD/TOC informed and aware.   Recommendations for follow up therapy are one component of a multi-disciplinary discharge planning process, led by the attending physician.  Recommendations may be updated based on patient status, additional functional criteria and insurance authorization.  Follow Up Recommendations  SNF     Equipment Recommendations       Recommendations for Other Services       Precautions / Restrictions Precautions Precautions: Fall Restrictions Weight Bearing Restrictions: No     Mobility  Bed Mobility Overal bed mobility: Needs Assistance Bed Mobility: Supine to Sit;Sit to Supine     Supine to sit: Min assist Sit to supine: Min guard        Transfers Overall transfer level: Needs assistance Equipment used: Rolling walker (2 wheeled) Transfers: Sit to/from Stand Sit to Stand: Min assist         General transfer comment: assist to stabilized/block RW, pulls heavily on walker  and relies on UB strength for lift off and standing balance  Ambulation/Gait Ambulation/Gait assistance: Min assist Gait Distance (Feet): 3 Feet Assistive device: Rolling walker (2 wheeled)       General Gait Details: lateral stepping edge of bed; poor foot clearance, poor balance (relying heavily on UE support throughout).  Fatigues quickly, unable to tolerate additional distance/activity as result.  Sats 88% on 2L with exertion, recovering >90% within 30 seconds of seated rest   Stairs             Wheelchair Mobility    Modified Rankin (Stroke Patients Only)       Balance Overall balance assessment: Needs assistance Sitting-balance support: No upper extremity supported;Feet supported Sitting balance-Leahy Scale: Good     Standing balance support: Bilateral upper extremity supported Standing balance-Leahy Scale: Fair                              Cognition Arousal/Alertness: Awake/alert Behavior During Therapy: WFL for tasks assessed/performed Overall Cognitive Status: Within Functional Limits for tasks assessed                                 General Comments: Very motivated to participate and progress, maintaining indep as much as possible      Exercises Other Exercises Other Exercises: Sit/stand x3 with RW, min assist-heavy use of UEs to complete    General Comments        Pertinent Vitals/Pain Pain Assessment: Faces Faces Pain Scale: Hurts even more Pain Location: back, abdomen, LEs, chest Pain Descriptors /  Indicators: Sore;Guarding;Constant Pain Intervention(s): Limited activity within patient's tolerance;Monitored during session;Repositioned    Home Living                      Prior Function            PT Goals (current goals can now be found in the care plan section) Acute Rehab PT Goals Patient Stated Goal: to return home PT Goal Formulation: With patient Time For Goal Achievement: 09/29/21 Potential  to Achieve Goals: Fair Progress towards PT goals: Not progressing toward goals - comment (limited activity tolerance this date)    Frequency    Min 2X/week      PT Plan Discharge plan needs to be updated    Co-evaluation              AM-PAC PT "6 Clicks" Mobility   Outcome Measure  Help needed turning from your back to your side while in a flat bed without using bedrails?: None Help needed moving from lying on your back to sitting on the side of a flat bed without using bedrails?: A Little Help needed moving to and from a bed to a chair (including a wheelchair)?: A Little Help needed standing up from a chair using your arms (e.g., wheelchair or bedside chair)?: A Little Help needed to walk in hospital room?: A Lot Help needed climbing 3-5 steps with a railing? : Total 6 Click Score: 16    End of Session Equipment Utilized During Treatment: Gait belt;Oxygen Activity Tolerance: Patient limited by fatigue Patient left: in bed;with call bell/phone within reach;with bed alarm set Nurse Communication: Mobility status PT Visit Diagnosis: Muscle weakness (generalized) (M62.81);Unsteadiness on feet (R26.81);Difficulty in walking, not elsewhere classified (R26.2)     Time: 2035-5974 PT Time Calculation (min) (ACUTE ONLY): 34 min  Charges:  $Therapeutic Activity: 23-37 mins                     Tedi Hughson H. Owens Shark, PT, DPT, NCS 09/17/21, 4:48 PM [phone]

## 2021-09-17 NOTE — Progress Notes (Signed)
Patient transferred to 252 accompanied by RT and CN. Patient on bipap at this time. VSS. Report given to TAO, 2 tele RN. No questions or concerns raised. Patient husband made aware of new room number.

## 2021-09-17 NOTE — Progress Notes (Signed)
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 Hx of colon cancer w/ mets to lungs w/ recent chemo, COVID19, asthma. Continue on supplemental oxygen, back at baseline today. Continue on IV abxs, bronchodilators and encourage incentive spirometry   Possible CAP: continue on IV rocephin, bronchodilators & supplemental oxygen. Encourage incentive spirometry   HTN urgency: resolved  Acute on chronic diastolic CHF exacerbation: much improved. Continue on coreg.  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 Cr is better than baseline   Normocytic anemia: possibly secondary to recent chemo. No need for a transfusion currently    Thrombocytopenia: likely secondary to recent chemo.   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 Continue on home dose of pregabalin, oxycodone, flexeril   Depression: severity unknown. Continue on home dose of duloxetine   Anxiety: severity unknown. Ativan prn   DM2: HbA1c 7.5, poorly controlled. Continue on glargine, SSI w/ accuchecks   Colon cancer: w/ mets to the lung. Management per onco at [facility]. Last chemo 09/10/21  Morbid obesity: BMI 48.2. Complicates overall care & prognosis      DVT prophylaxis: heparin  Code Status: full  Family Communication: discussed w/ pt's husband and answered his questions.  Disposition Plan: likely d/c back home   Status is: Inpatient  Remains inpatient appropriate because:Unsafe d/c  plan, IV treatments appropriate due to intensity of illness or inability to take PO, and Inpatient level of care appropriate due to severity of illness, d/c home tomorrow  Dispo: The patient is from: Home              Anticipated d/c is to: home w/ home health               Patient currently is not medically stable to d/c.   Difficult to place patient : unclear     Level of care: Progressive Cardiac Consultants:    Procedures:  Antimicrobials: rocephin   Subjective: Pt c/o chronic back pain   Objective: Vitals:   09/17/21 0100 09/17/21 0130 09/17/21 0300 09/17/21 0642  BP:   123/67   Pulse: 82 81 79   Resp: (!) 22 (!) 22 20   Temp:   98.5 F (36.9 C)   TempSrc:   Oral   SpO2: 100% 100% 100%   Weight:    123 kg  Height:        Intake/Output Summary (Last 24 hours) at 09/17/2021 0741 Last data filed at 09/17/2021 0300 Gross per 24 hour  Intake 440 ml  Output 300 ml  Net 140 ml   Filed Weights   09/13/21 0006 09/15/21 0500 09/17/21 0642  Weight: 113.4 kg 115.8 kg 123 kg    Examination:  General exam: Appears comfortable. Morbidly obese Respiratory system: diminished breath sounds b/l  Cardiovascular system: S1 & S2+. No rubs or clicks  Gastrointestinal system: Abd is soft, NT, obese & hypoactive bowel sounds  Central nervous system: Awake and alert. Moves all extremities  Psychiatry: Judgement  and insight appear normal. Flat mood and affect    Data Reviewed: I have personally reviewed following labs and imaging studies  CBC: Recent Labs  Lab 09/12/21 1936 09/13/21 0420 09/14/21 0419 09/15/21 0522 09/16/21 0529 09/17/21 0536  WBC 14.6* 9.8 6.0 6.5 4.5 5.1  NEUTROABS 11.0*  --  4.2  --   --   --   HGB 10.8* 10.3* 7.5* 7.3* 7.5* 7.7*  HCT 33.0* 32.6* 23.7* 23.1* 23.8* 24.2*  MCV 93.5 92.6 94.4 97.1 94.1 96.0  PLT 174 139* 96* 96* 92* 94*   Basic Metabolic Panel: Recent Labs  Lab 09/13/21 0420 09/14/21 0419 09/15/21 0522 09/16/21 0529  09/17/21 0536  NA 146* 142 143 143 143  K 4.2 4.2 3.9 4.2 4.7  CL 110 108 107 106 106  CO2 26 25 29 27 26   GLUCOSE 189* 272* 243* 203* 223*  BUN 50* 58* 71* 68* 61*  CREATININE 2.21* 2.73* 2.89* 2.73* 2.52*  CALCIUM 8.9 8.3* 8.1* 8.2* 8.5*  MG 2.1 2.1  --  2.1 2.4  PHOS 4.4 5.5*  --   --   --    GFR: Estimated Creatinine Clearance: 32.1 mL/min (A) (by C-G formula based on SCr of 2.52 mg/dL (H)). Liver Function Tests: Recent Labs  Lab 09/12/21 1936  AST 19  ALT 18  ALKPHOS 84  BILITOT 0.8  PROT 7.2  ALBUMIN 3.5   No results for input(s): LIPASE, AMYLASE in the last 168 hours. No results for input(s): AMMONIA in the last 168 hours. Coagulation Profile: Recent Labs  Lab 09/12/21 1936  INR 1.1   Cardiac Enzymes: No results for input(s): CKTOTAL, CKMB, CKMBINDEX, TROPONINI in the last 168 hours. BNP (last 3 results) No results for input(s): PROBNP in the last 8760 hours. HbA1C: No results for input(s): HGBA1C in the last 72 hours.  CBG: Recent Labs  Lab 09/15/21 2229 09/16/21 0758 09/16/21 1204 09/16/21 1547 09/16/21 2136  GLUCAP 292* 170* 220* 281* 237*   Lipid Profile: No results for input(s): CHOL, HDL, LDLCALC, TRIG, CHOLHDL, LDLDIRECT in the last 72 hours. Thyroid Function Tests: No results for input(s): TSH, T4TOTAL, FREET4, T3FREE, THYROIDAB in the last 72 hours. Anemia Panel: No results for input(s): VITAMINB12, FOLATE, FERRITIN, TIBC, IRON, RETICCTPCT in the last 72 hours. Sepsis Labs: Recent Labs  Lab 09/12/21 1937 09/13/21 0420 09/14/21 0419 09/15/21 0522 09/16/21 0529  PROCALCITON  --  1.62 2.82 1.81 1.04  LATICACIDVEN 1.6 0.8  --   --   --     Recent Results (from the past 240 hour(s))  Resp Panel by RT-PCR (Flu A&B, Covid) Nasopharyngeal Swab     Status: None   Collection Time: 09/12/21  9:32 PM   Specimen: Nasopharyngeal Swab; Nasopharyngeal(NP) swabs in vial transport medium  Result Value Ref Range Status   SARS Coronavirus 2 by RT  PCR NEGATIVE NEGATIVE Final    Comment: (NOTE) SARS-CoV-2 target nucleic acids are NOT DETECTED.  The SARS-CoV-2 RNA is generally detectable in upper respiratory specimens during the acute phase of infection. The lowest concentration of SARS-CoV-2 viral copies this assay can detect is 138 copies/mL. A negative result does not preclude SARS-Cov-2 infection and should not be used as the sole basis for treatment or other patient management decisions. A negative result may occur with  improper specimen collection/handling, submission of specimen other than nasopharyngeal swab, presence of viral mutation(s) within the areas targeted by this assay, and inadequate number of viral copies(<138 copies/mL). A negative result must be  combined with clinical observations, patient history, and epidemiological information. The expected result is Negative.  Fact Sheet for Patients:  EntrepreneurPulse.com.au  Fact Sheet for Healthcare Providers:  IncredibleEmployment.be  This test is no t yet approved or cleared by the [place] FDA and  has been authorized for detection and/or diagnosis of SARS-CoV-2 by FDA under an Emergency Use Authorization (EUA). This EUA will remain  in effect (meaning this test can be used) for the duration of the COVID-19 declaration under Section 564(b)(1) of the Act, 21 U.S.C.section 360bbb-3(b)(1), unless the authorization is terminated  or revoked sooner.       Influenza A by PCR NEGATIVE NEGATIVE Final   Influenza B by PCR NEGATIVE NEGATIVE Final    Comment: (NOTE) The Xpert Xpress SARS-CoV-2/FLU/RSV plus assay is intended as an aid in the diagnosis of influenza from Nasopharyngeal swab specimens and should not be used as a sole basis for treatment. Nasal washings and aspirates are unacceptable for Xpert Xpress SARS-CoV-2/FLU/RSV testing.  Fact Sheet for Patients: EntrepreneurPulse.com.au  Fact Sheet for  Healthcare Providers: IncredibleEmployment.be  This test is not yet approved or cleared by the [place] FDA and has been authorized for detection and/or diagnosis of SARS-CoV-2 by FDA under an Emergency Use Authorization (EUA). This EUA will remain in effect (meaning this test can be used) for the duration of the COVID-19 declaration under Section 564(b)(1) of the Act, 21 U.S.C. section 360bbb-3(b)(1), unless the authorization is terminated or revoked.  Performed at [facility], [address] [place], [place] [address]   MRSA Next Gen by PCR, Nasal     Status: None   Collection Time: 09/13/21  2:59 AM   Specimen: Nasal Mucosa; Nasal Swab  Result Value Ref Range Status   MRSA by PCR Next Gen NOT DETECTED NOT DETECTED Final    Comment: (NOTE) The GeneXpert MRSA Assay (FDA approved for NASAL specimens only), is one component of a comprehensive MRSA colonization surveillance program. It is not intended to diagnose MRSA infection nor to guide or monitor treatment for MRSA infections. Test performance is not FDA approved in patients less than 65 years old. Performed at [facility], [address] [place], [place] [address]   Culture, blood (single) w Reflex to ID Panel     Status: None (Preliminary result)   Collection Time: 09/13/21  4:20 AM   Specimen: BLOOD  Result Value Ref Range Status   Specimen Description BLOOD LEFT [facility]  Final   Special Requests   Final    BOTTLES DRAWN AEROBIC AND ANAEROBIC Blood Culture adequate volume   Culture   Final    NO GROWTH 4 DAYS Performed at [facility], [address] [place], [place] [address]    Report Status PENDING  Incomplete         Radiology Studies: No results found.      Scheduled Meds:  amLODipine  10 mg Oral Daily   aspirin EC  81 mg Oral Daily   atorvastatin  10 mg Oral Daily   bisacodyl  10 mg Oral Daily   carvedilol  25 mg Oral BID   Chlorhexidine  Gluconate Cloth  6 each Topical Daily   cloNIDine  0.1 mg Oral BID   docusate sodium  100 mg Oral BID   DULoxetine  60 mg Oral BID   heparin  5,000 Units Subcutaneous Q8H   insulin aspart  0-15 Units Subcutaneous TID WC   insulin aspart  0-5 Units Subcutaneous QHS  insulin aspart  4 Units Subcutaneous TID WC   insulin glargine-yfgn  12 Units Subcutaneous QHS   isosorbide mononitrate  30 mg Oral Daily   OLANZapine  5 mg Oral QHS   pantoprazole  40 mg Oral Daily   polyethylene glycol  17 g Oral Daily   potassium chloride  10 mEq Oral Daily   pregabalin  150 mg Oral TID   Continuous Infusions:  cefTRIAXone (ROCEPHIN)  IV Stopped (09/16/21 2250)     LOS: 5 days    Time spent: 20 mins     Wyvonnia Dusky, MD [facility] Pager [phone]  If 7PM-7AM, please contact night-coverage 09/17/2021, 7:41 AM

## 2021-09-17 NOTE — Care Management Important Message (Signed)
Important Message  Patient Details  Name: SHUKRI NISTLER MRN: [id number] Date of Birth: 1971/09/14   Medicare Important Message Given:  Yes     Dannette Barbara 09/17/2021, 1:40 PM

## 2021-09-18 LAB — CULTURE, BLOOD (SINGLE)
Culture: NO GROWTH
Special Requests: ADEQUATE

## 2021-09-18 LAB — BASIC METABOLIC PANEL
Anion gap: 9 (ref 5–15)
BUN: 64 mg/dL — ABNORMAL HIGH (ref 6–20)
CO2: 28 mmol/L (ref 22–32)
Calcium: 8.6 mg/dL — ABNORMAL LOW (ref 8.9–10.3)
Chloride: 109 mmol/L (ref 98–111)
Creatinine, Ser: 2.46 mg/dL — ABNORMAL HIGH (ref 0.44–1.00)
GFR, Estimated: 23 mL/min — ABNORMAL LOW (ref >60)
Glucose, Bld: 212 mg/dL — ABNORMAL HIGH (ref 70–99)
Potassium: 5.2 mmol/L — ABNORMAL HIGH (ref 3.5–5.1)
Sodium: 146 mmol/L — ABNORMAL HIGH (ref 135–145)

## 2021-09-18 LAB — MAGNESIUM: Magnesium: 2.5 mg/dL — ABNORMAL HIGH (ref 1.7–2.4)

## 2021-09-18 LAB — CBC
HCT: 24.5 % — ABNORMAL LOW (ref 36.0–46.0)
Hemoglobin: 7.4 g/dL — ABNORMAL LOW (ref 12.0–15.0)
MCH: 28.6 pg (ref 26.0–34.0)
MCHC: 30.2 g/dL (ref 30.0–36.0)
MCV: 94.6 fL (ref 80.0–100.0)
Platelets: 104 10*3/uL — ABNORMAL LOW (ref 150–400)
RBC: 2.59 MIL/uL — ABNORMAL LOW (ref 3.87–5.11)
RDW: 15.7 % — ABNORMAL HIGH (ref 11.5–15.5)
WBC: 6.2 10*3/uL (ref 4.0–10.5)
nRBC: 0 % (ref 0.0–0.2)

## 2021-09-18 LAB — GLUCOSE, CAPILLARY
Glucose-Capillary: 199 mg/dL — ABNORMAL HIGH (ref 70–99)
Glucose-Capillary: 291 mg/dL — ABNORMAL HIGH (ref 70–99)

## 2021-09-18 LAB — POTASSIUM: Potassium: 4.5 mmol/L (ref 3.5–5.1)

## 2021-09-18 MED ORDER — POTASSIUM CHLORIDE ER 10 MEQ PO TBCR: 10.0000 meq | EXTENDED_RELEASE_TABLET | Freq: Every day | ORAL | Status: AC

## 2021-09-18 MED ORDER — FUROSEMIDE 10 MG/ML IJ SOLN
40.0000 mg | Freq: Once | INTRAMUSCULAR | Status: AC
  Administered 2021-09-18: 40 mg via INTRAVENOUS
  Filled 2021-09-18: qty 4

## 2021-09-18 MED ORDER — SODIUM ZIRCONIUM CYCLOSILICATE 10 G PO PACK
10.0000 g | PACK | Freq: Once | ORAL | Status: AC
  Administered 2021-09-18: 10 g via ORAL
  Filled 2021-09-18: qty 1

## 2021-09-18 NOTE — TOC Transition Note (Signed)
Transition of Care [facility]) - CM/SW Discharge Note   Patient Details  Name: Rebecca Campbell MRN: [id number] Date of Birth: 11/03/1971  [facility] CM/SW Contact:  Magnus Ivan, LCSW Phone Number: 09/18/2021, 1:48 PM   Clinical Narrative:   Patient is discharging home today. Home Health orders have been faxed to [facility] (fax #: [phone]).    Final next level of care: [place] Barriers to Discharge: Barriers Resolved   Patient Goals and CMS Choice        Discharge Placement                       Discharge Plan and Services                          HH Arranged: PT [place] Agency: Other - See comment ([place])     Representative spoke with at [place]: referral faxed today 09/18/21  Social Determinants of Health (SDOH) Interventions     Readmission Risk Interventions No flowsheet data found.

## 2021-09-18 NOTE — Discharge Summary (Signed)
Physician Discharge Summary  Rebecca Campbell EHU:[id number] DOB: [birth year] DOA: 09/12/2021  PCP: Pcp, No  Admit date: 09/12/2021 Discharge date: 09/18/2021  Admitted From: home  Disposition:  home w/ home health   Recommendations for Outpatient Follow-up:  Follow up with PCP in 1 week  F/u w/ onco w/in 1 week  Home Health: yes  Equipment/Devices:  Discharge Condition: stable  CODE STATUS: full  Diet recommendation: Heart Healthy / Carb Modified   Brief/Interim Summary: HPI was taken from NP Rust-Chester: 50 yo F presented at [facility] ED on 09/12/21 from home with husband due to worsening shortness of breath over the past 2-3 days.  Of note patient restarted chemotherapy treatment 2 days ago on Friday, 09/10/2021, after a hiatus from treatment due to COVID-19 infection and hospitalization in August 2022.  Patient and her husband who is bedside deny any recent sick contacts, fever/chills, GI symptoms out of the ordinary intermittent diarrhea.  Patient does endorse a chronic cough with some worsening darker sputum over the last 2 days.  She also admits to pain across her chest down her sides and her back that she reports is because of her "hard breathing".   ED course: Upon arrival patient was in respiratory distress, per ED documentation: Tripoding /guppy breathing in an extremis being bagged by EMS.  First SPO2 reading with a good waveform was 18%.  Patient was placed on BiPAP at 100% FiO2, received 80 mg Lasix, 125 mg Solu-Medrol & Duo-Neb.  She was also tachycardic and hypertensive, Nitro drip started.  Patient stabilized and PCCM was consulted for admission to ICU due to high risk for intubation.  As per NP Dewaine Conger: 09/12/21: Patient admitted in respiratory distress requiring BIPAP support 09/13/21: Pt remains off Bipap and tolerating well  09/14/21: Diuresed 1.6L yesterday, holding diuresis today due to increase in Creatinine  Hospital course from Dr. Jimmye Norman 9/21-9/24/22: Pt's home oxygen was  weaned back to pt's baseline prior to d/c. Of note, pt completed her abx course for possible CAP while inpatient. PT/OT initially evaluated pt and recommended HH but upon re-evaluation, PT rec SNF. Pt and pt's husband both refused SNF placement and pt's husband stated he could hire aides to help the pt at home.   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 Hx of colon cancer w/ mets to lungs w/ recent chemo, COVID19, asthma. Continue on supplemental oxygen, back at baseline today. Completed abx course. Continue on bronchodilators and encourage incentive spirometry   Possible CAP: completed abx course. Continue bronchodilators & supplemental oxygen. Encourage incentive spirometry   HTN urgency: resolved  Acute on chronic diastolic CHF exacerbation: much improved. Continue on coreg.  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 Cr is better than baseline   Normocytic anemia: possibly secondary to recent chemo. No need for a transfusion currently    Thrombocytopenia: likely secondary to recent chemo.   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 Continue on home dose of pregabalin, oxycodone, flexeril   Depression: severity unknown. Continue on home dose of duloxetine   Anxiety: severity unknown. Ativan prn   DM2:  HbA1c 7.5, poorly controlled. Continue on glargine, SSI w/ accuchecks   Colon cancer: w/ mets to the lung. Management per onco at [facility]. Last chemo 09/10/21  Morbid obesity: BMI 48.2. Complicates overall care & prognosis    Discharge  Instructions  Discharge Instructions     Diet - low sodium heart healthy   Complete by: As directed    Diet Carb Modified   Complete by: As directed    Discharge instructions   Complete by: As directed    F/u w/ PCP in 1 week. F/u w/ onco within 1 week   Increase activity slowly   Complete by: As directed       Allergies as of 09/18/2021       Reactions   Keflex [cephalexin]    Latex    Morphine And Related    Nafcillin Sodium In Dextrose    Sulfa Antibiotics         Medication List     TAKE these medications    acetaminophen 325 MG tablet Commonly known as: TYLENOL Take 650 mg by mouth every 8 (eight) hours as needed.   amLODipine 10 MG tablet Commonly known as: NORVASC Take 10 mg by mouth daily.   aspirin 81 MG EC tablet Take 81 mg by mouth daily.   atorvastatin 10 MG tablet Commonly known as: LIPITOR Take 1 tablet by mouth daily.   benzonatate 200 MG capsule Commonly known as: TESSALON Take by mouth.   carvedilol 25 MG tablet Commonly known as: COREG Take 25 mg by mouth 2 (two) times daily.   cloNIDine 0.1 MG tablet Commonly known as: CATAPRES Take 0.1 mg by mouth 2 (two) times daily.   cyclobenzaprine 10 MG tablet Commonly known as: FLEXERIL Take 10 mg by mouth 2 (two) times daily as needed.   diphenoxylate-atropine 2.5-0.025 MG tablet Commonly known as: LOMOTIL Take 2 tablets by mouth 4 (four) times daily as needed.   DSS 100 MG Caps Take 1 tablet by mouth 2 (two) times daily as needed.   Dulaglutide 3 MG/0.5ML Sopn Inject 3 mg into the skin every 7 (seven) days.   DULoxetine 60 MG capsule Commonly known as: CYMBALTA Take 60 mg by mouth 2 (two) times daily.   empagliflozin 10 MG Tabs tablet Commonly known as: JARDIANCE Take 1 tablet by mouth daily.   ergocalciferol 1.25 MG (50000 UT) capsule Commonly known as: VITAMIN D2 Take 1 capsule by mouth every 7 (seven) days.   fenofibrate 54 MG tablet Take 54 mg by mouth every  morning.   HumuLIN 70/30 KwikPen (70-30) 100 UNIT/ML KwikPen Generic drug: insulin isophane & regular human KwikPen Inject 70 Units into the skin daily with breakfast.   isosorbide mononitrate 30 MG 24 hr tablet Commonly known as: IMDUR Take 30 mg by mouth daily.   LORazepam 0.5 MG tablet Commonly known as: ATIVAN Take 0.5 mg by mouth daily as needed.   losartan 100 MG tablet Commonly known as: COZAAR Take 1 tablet by mouth daily.   methocarbamol 750 MG tablet Commonly known as: ROBAXIN Take 750 mg by mouth 2 (two) times daily as needed.   NovoLIN R FlexPen ReliOn 100 UNIT/ML KwikPen Generic drug: Insulin Regular Human Inject 45 Units into the skin at bedtime.   OLANZapine 5 MG tablet Commonly known as: ZYPREXA Take 5 mg by mouth at bedtime.   omeprazole 20 MG capsule Commonly known as: PRILOSEC Take 20 mg by mouth 2 (two) times daily.   ondansetron 8 MG tablet  Commonly known as: ZOFRAN Take 1 tablet by mouth 2 (two) times daily as needed.   oxyCODONE 5 MG immediate release tablet Commonly known as: Oxy IR/ROXICODONE Take 5 mg by mouth every 6 (six) hours as needed.   potassium chloride 10 MEQ tablet Commonly known as: KLOR-CON Take 1 tablet (10 mEq total) by mouth daily. Hold this medication until you see your PCP What changed: additional instructions   pregabalin 150 MG capsule Commonly known as: LYRICA Take 150 mg by mouth 3 (three) times daily.   prochlorperazine 10 MG tablet Commonly known as: COMPAZINE Take 10 mg by mouth every 6 (six) hours as needed.   spironolactone 25 MG tablet Commonly known as: ALDACTONE Take 25 mg by mouth daily.   topiramate 200 MG tablet Commonly known as: TOPAMAX Take 1 tablet by mouth at bedtime as needed.   torsemide 20 MG tablet Commonly known as: DEMADEX Take 40 mg by mouth every morning.   traMADol 50 MG tablet Commonly known as: ULTRAM Take 50 mg by mouth 2 (two) times daily as needed.        Allergies   Allergen Reactions   Keflex [Cephalexin]    Latex    Morphine And Related    Nafcillin Sodium In Dextrose    Sulfa Antibiotics     Consultations: ICU   Procedures/Studies: DG Chest Port 1 View  Result Date: 09/13/2021 CLINICAL DATA:  Acute on chronic respiratory distress. EXAM: PORTABLE CHEST 1 VIEW COMPARISON:  09/12/2021 FINDINGS: Shallow inspiration and kyphosis limit evaluation. Cardiac enlargement with perihilar infiltration or edema. No pleural effusions. No apparent pneumothorax. Right central venous catheter with tip over the SVC. No significant change since prior study. IMPRESSION: Technically limited study. Cardiac enlargement with perihilar infiltration similar to previous study. Electronically Signed   By: Lucienne Capers M.D.   On: 09/13/2021 03:52   DG Chest Portable 1 View  Result Date: 09/12/2021 CLINICAL DATA:  Respiratory distress. EXAM: PORTABLE CHEST 1 VIEW COMPARISON:  None. FINDINGS: Shallow inspiration and kyphotic positioning limit examination. Cardiac enlargement. Suggestion of hazy perihilar opacities, possibly edema or pneumonia. No pleural effusions. Right central venous catheter with tip over the cavoatrial junction region. IMPRESSION: Technically limited study with shallow inspiration. Cardiac enlargement with probable perihilar edema. Electronically Signed   By: Lucienne Capers M.D.   On: 09/12/2021 20:07   ECHOCARDIOGRAM COMPLETE  Result Date: 09/14/2021    ECHOCARDIOGRAM REPORT   Patient Name:   Rebecca Campbell Date of Exam: 09/14/2021 Medical Rec #:  [id number]       Height:       61.0 in Accession #:    [id number]      Weight:       250.0 lb Date of Birth:  September 24, 1971        BSA:          2.077 m Patient Age:    28 years        BP:           154/83 mmHg Patient Gender: F               HR:           88 bpm. Exam Location:  [facility] Procedure: 2D Echo, Cardiac Doppler and Color Doppler Indications:     cardiomyopathy -unspecified I42.9  History:         Patient  has no prior history of Echocardiogram examinations.  Risk Factors:Hypertension and Diabetes. Atypical chest pain.  Sonographer:     Sherrie Sport Referring Phys:  [id number] BRITTON L RUST-CHESTER Diagnosing Phys: Yolonda Kida MD  Sonographer Comments: No apical window, no subcostal window and Technically challenging study due to limited acoustic windows. Thse only view obtainable was parasternal. IMPRESSIONS  1. Left ventricular ejection fraction, by estimation, is 70 to 75%. The left ventricle has hyperdynamic function. The left ventricle has no regional wall motion abnormalities. There is moderate concentric left ventricular hypertrophy. Left ventricular diastolic function could not be evaluated.  2. Right ventricular systolic function is normal. The right ventricular size is normal.  3. The mitral valve is normal in structure. Trivial mitral valve regurgitation.  4. The aortic valve is normal in structure. Aortic valve regurgitation is not visualized. FINDINGS  Left Ventricle: Left ventricular ejection fraction, by estimation, is 70 to 75%. The left ventricle has hyperdynamic function. The left ventricle has no regional wall motion abnormalities. The left ventricular internal cavity size was normal in size. There is moderate concentric left ventricular hypertrophy. Left ventricular diastolic function could not be evaluated. Right Ventricle: The right ventricular size is normal. No increase in right ventricular wall thickness. Right ventricular systolic function is normal. Left Atrium: Left atrial size was normal in size. Right Atrium: Right atrial size was normal in size. Pericardium: There is no evidence of pericardial effusion. Mitral Valve: The mitral valve is normal in structure. Trivial mitral valve regurgitation. Tricuspid Valve: The tricuspid valve is normal in structure. Tricuspid valve regurgitation is trivial. Aortic Valve: The aortic valve is normal in structure. Aortic valve  regurgitation is not visualized. Pulmonic Valve: The pulmonic valve was normal in structure. Pulmonic valve regurgitation is not visualized. Aorta: The ascending aorta was not well visualized. IAS/Shunts: No atrial level shunt detected by color flow Doppler.  LEFT VENTRICLE PLAX 2D LVIDd:         4.40 cm LVIDs:         2.60 cm LV PW:         1.70 cm LV IVS:        1.60 cm LVOT diam:     2.00 cm LVOT Area:     3.14 cm  LEFT ATRIUM         Index LA diam:    5.20 cm 2.50 cm/m                        PULMONIC VALVE AORTA                 PV Vmax:        1.02 m/s Ao Root diam: 3.40 cm PV Peak grad:   4.2 mmHg                       RVOT Peak grad: 6 mmHg   SHUNTS Systemic Diam: 2.00 cm Dwayne Prince Rome MD Electronically signed by Yolonda Kida MD Signature Date/Time: 09/14/2021/9:34:14 PM    Final    (Echo, Carotid, EGD, Colonoscopy, ERCP)    Subjective: Pt c/o being hungry    Discharge Exam: Vitals:   09/18/21 0740 09/18/21 1151  BP: 114/65 128/64  Pulse: 81 89  Resp: 20 18  Temp: 99.1 F (37.3 C) 99 F (37.2 C)  SpO2: 100% 100%   Vitals:   09/17/21 2317 09/18/21 0518 09/18/21 0740 09/18/21 1151  BP: 115/63 112/62 114/65 128/64  Pulse: 89 80 81 89  Resp:  18 18 20 18   Temp: 98.5 F (36.9 C) 99.1 F (37.3 C) 99.1 F (37.3 C) 99 F (37.2 C)  TempSrc:   Oral Oral  SpO2: 100% 100% 100% 100%  Weight:      Height:        General: Pt is alert, awake, not in acute distress. Morbidly obese Cardiovascular : S1/S2 +, no rubs, no gallops Respiratory: diminished breath sounds b/l  Abdominal: Soft, NT, ND, bowel sounds + Extremities: b/l LE edema, no cyanosis    The results of significant diagnostics from this hospitalization (including imaging, microbiology, ancillary and laboratory) are listed below for reference.     Microbiology: Recent Results (from the past 240 hour(s))  Resp Panel by RT-PCR (Flu A&B, Covid) Nasopharyngeal Swab     Status: None   Collection Time: 09/12/21  9:32  PM   Specimen: Nasopharyngeal Swab; Nasopharyngeal(NP) swabs in vial transport medium  Result Value Ref Range Status   SARS Coronavirus 2 by RT PCR NEGATIVE NEGATIVE Final    Comment: (NOTE) SARS-CoV-2 target nucleic acids are NOT DETECTED.  The SARS-CoV-2 RNA is generally detectable in upper respiratory specimens during the acute phase of infection. The lowest concentration of SARS-CoV-2 viral copies this assay can detect is 138 copies/mL. A negative result does not preclude SARS-Cov-2 infection and should not be used as the sole basis for treatment or other patient management decisions. A negative result may occur with  improper specimen collection/handling, submission of specimen other than nasopharyngeal swab, presence of viral mutation(s) within the areas targeted by this assay, and inadequate number of viral copies(<138 copies/mL). A negative result must be combined with clinical observations, patient history, and epidemiological information. The expected result is Negative.  Fact Sheet for Patients:  EntrepreneurPulse.com.au  Fact Sheet for Healthcare Providers:  IncredibleEmployment.be  This test is no t yet approved or cleared by the [place] FDA and  has been authorized for detection and/or diagnosis of SARS-CoV-2 by FDA under an Emergency Use Authorization (EUA). This EUA will remain  in effect (meaning this test can be used) for the duration of the COVID-19 declaration under Section 564(b)(1) of the Act, 21 U.S.C.section 360bbb-3(b)(1), unless the authorization is terminated  or revoked sooner.       Influenza A by PCR NEGATIVE NEGATIVE Final   Influenza B by PCR NEGATIVE NEGATIVE Final    Comment: (NOTE) The Xpert Xpress SARS-CoV-2/FLU/RSV plus assay is intended as an aid in the diagnosis of influenza from Nasopharyngeal swab specimens and should not be used as a sole basis for treatment. Nasal washings and aspirates are  unacceptable for Xpert Xpress SARS-CoV-2/FLU/RSV testing.  Fact Sheet for Patients: EntrepreneurPulse.com.au  Fact Sheet for Healthcare Providers: IncredibleEmployment.be  This test is not yet approved or cleared by the [place] FDA and has been authorized for detection and/or diagnosis of SARS-CoV-2 by FDA under an Emergency Use Authorization (EUA). This EUA will remain in effect (meaning this test can be used) for the duration of the COVID-19 declaration under Section 564(b)(1) of the Act, 21 U.S.C. section 360bbb-3(b)(1), unless the authorization is terminated or revoked.  Performed at [facility], [address] [place], [place] [address]   MRSA Next Gen by PCR, Nasal     Status: None   Collection Time: 09/13/21  2:59 AM   Specimen: Nasal Mucosa; Nasal Swab  Result Value Ref Range Status   MRSA by PCR Next Gen NOT DETECTED NOT DETECTED Final    Comment: (NOTE) The GeneXpert  MRSA Assay (FDA approved for NASAL specimens only), is one component of a comprehensive MRSA colonization surveillance program. It is not intended to diagnose MRSA infection nor to guide or monitor treatment for MRSA infections. Test performance is not FDA approved in patients less than 69 years old. Performed at [facility], [address] [place], [place] [address]   Culture, blood (single) w Reflex to ID Panel     Status: None   Collection Time: 09/13/21  4:20 AM   Specimen: BLOOD  Result Value Ref Range Status   Specimen Description BLOOD LEFT [facility]  Final   Special Requests   Final    BOTTLES DRAWN AEROBIC AND ANAEROBIC Blood Culture adequate volume   Culture   Final    NO GROWTH 5 DAYS Performed at [facility], [address] [place], [place] [address]    Report Status 09/18/2021 FINAL  Final     Labs: BNP (last 3 results) Recent Labs    09/12/21 1936  BNP 073.7*   Basic Metabolic Panel: Recent Labs  Lab  09/13/21 0420 09/14/21 0419 09/15/21 0522 09/16/21 0529 09/17/21 0536 09/18/21 0655 09/18/21 1210  NA 146* 142 143 143 143 146*  --   K 4.2 4.2 3.9 4.2 4.7 5.2* 4.5  CL 110 108 107 106 106 109  --   CO2 26 25 29 27 26 28   --   GLUCOSE 189* 272* 243* 203* 223* 212*  --   BUN 50* 58* 71* 68* 61* 64*  --   CREATININE 2.21* 2.73* 2.89* 2.73* 2.52* 2.46*  --   CALCIUM 8.9 8.3* 8.1* 8.2* 8.5* 8.6*  --   MG 2.1 2.1  --  2.1 2.4 2.5*  --   PHOS 4.4 5.5*  --   --   --   --   --    Liver Function Tests: Recent Labs  Lab 09/12/21 1936  AST 19  ALT 18  ALKPHOS 84  BILITOT 0.8  PROT 7.2  ALBUMIN 3.5   No results for input(s): LIPASE, AMYLASE in the last 168 hours. No results for input(s): AMMONIA in the last 168 hours. CBC: Recent Labs  Lab 09/12/21 1936 09/13/21 0420 09/14/21 0419 09/15/21 0522 09/16/21 0529 09/17/21 0536 09/18/21 0655  WBC 14.6*   < > 6.0 6.5 4.5 5.1 6.2  NEUTROABS 11.0*  --  4.2  --   --   --   --   HGB 10.8*   < > 7.5* 7.3* 7.5* 7.7* 7.4*  HCT 33.0*   < > 23.7* 23.1* 23.8* 24.2* 24.5*  MCV 93.5   < > 94.4 97.1 94.1 96.0 94.6  PLT 174   < > 96* 96* 92* 94* 104*   < > = values in this interval not displayed.   Cardiac Enzymes: No results for input(s): CKTOTAL, CKMB, CKMBINDEX, TROPONINI in the last 168 hours. BNP: Invalid input(s): POCBNP CBG: Recent Labs  Lab 09/17/21 1216 09/17/21 1623 09/17/21 2031 09/18/21 0748 09/18/21 1153  GLUCAP 322* 231* 269* 199* 291*   D-Dimer No results for input(s): DDIMER in the last 72 hours. Hgb A1c No results for input(s): HGBA1C in the last 72 hours. Lipid Profile No results for input(s): CHOL, HDL, LDLCALC, TRIG, CHOLHDL, LDLDIRECT in the last 72 hours. Thyroid function studies No results for input(s): TSH, T4TOTAL, T3FREE, THYROIDAB in the last 72 hours.  Invalid input(s): FREET3 Anemia work up No results for input(s): VITAMINB12, FOLATE, FERRITIN, TIBC, IRON, RETICCTPCT in the last 35  hours. Urinalysis    Component Value Date/Time   COLORURINE YELLOW 09/12/2021 2255   APPEARANCEUR CLEAR 09/12/2021 2255   LABSPEC 1.020 09/12/2021 2255   PHURINE 5.5 09/12/2021 2255   GLUCOSEU 500 (A) 09/12/2021 2255   HGBUR TRACE (A) 09/12/2021 2255   BILIRUBINUR NEGATIVE 09/12/2021 2255   KETONESUR NEGATIVE 09/12/2021 2255   PROTEINUR >300 (A) 09/12/2021 2255   NITRITE NEGATIVE 09/12/2021 2255   LEUKOCYTESUR NEGATIVE 09/12/2021 2255   Sepsis Labs Invalid input(s): PROCALCITONIN,  WBC,  LACTICIDVEN Microbiology Recent Results (from the past 240 hour(s))  Resp Panel by RT-PCR (Flu A&B, Covid) Nasopharyngeal Swab     Status: None   Collection Time: 09/12/21  9:32 PM   Specimen: Nasopharyngeal Swab; Nasopharyngeal(NP) swabs in vial transport medium  Result Value Ref Range Status   SARS Coronavirus 2 by RT PCR NEGATIVE NEGATIVE Final    Comment: (NOTE) SARS-CoV-2 target nucleic acids are NOT DETECTED.  The SARS-CoV-2 RNA is generally detectable in upper respiratory specimens during the acute phase of infection. The lowest concentration of SARS-CoV-2 viral copies this assay can detect is 138 copies/mL. A negative result does not preclude SARS-Cov-2 infection and should not be used as the sole basis for treatment or other patient management decisions. A negative result may occur with  improper specimen collection/handling, submission of specimen other than nasopharyngeal swab, presence of viral mutation(s) within the areas targeted by this assay, and inadequate number of viral copies(<138 copies/mL). A negative result must be combined with clinical observations, patient history, and epidemiological information. The expected result is Negative.  Fact Sheet for Patients:  EntrepreneurPulse.com.au  Fact Sheet for Healthcare Providers:  IncredibleEmployment.be  This test is no t yet approved or cleared by the [place] FDA and  has been  authorized for detection and/or diagnosis of SARS-CoV-2 by FDA under an Emergency Use Authorization (EUA). This EUA will remain  in effect (meaning this test can be used) for the duration of the COVID-19 declaration under Section 564(b)(1) of the Act, 21 U.S.C.section 360bbb-3(b)(1), unless the authorization is terminated  or revoked sooner.       Influenza A by PCR NEGATIVE NEGATIVE Final   Influenza B by PCR NEGATIVE NEGATIVE Final    Comment: (NOTE) The Xpert Xpress SARS-CoV-2/FLU/RSV plus assay is intended as an aid in the diagnosis of influenza from Nasopharyngeal swab specimens and should not be used as a sole basis for treatment. Nasal washings and aspirates are unacceptable for Xpert Xpress SARS-CoV-2/FLU/RSV testing.  Fact Sheet for Patients: EntrepreneurPulse.com.au  Fact Sheet for Healthcare Providers: IncredibleEmployment.be  This test is not yet approved or cleared by the [place] FDA and has been authorized for detection and/or diagnosis of SARS-CoV-2 by FDA under an Emergency Use Authorization (EUA). This EUA will remain in effect (meaning this test can be used) for the duration of the COVID-19 declaration under Section 564(b)(1) of the Act, 21 U.S.C. section 360bbb-3(b)(1), unless the authorization is terminated or revoked.  Performed at [facility], [address] [place], [place] [address]   MRSA Next Gen by PCR, Nasal     Status: None   Collection Time: 09/13/21  2:59 AM   Specimen: Nasal Mucosa; Nasal Swab  Result Value Ref Range Status   MRSA by PCR Next Gen NOT DETECTED NOT DETECTED Final    Comment: (NOTE) The GeneXpert MRSA Assay (FDA approved for NASAL specimens only), is one component of a comprehensive MRSA colonization surveillance program. It is not intended to diagnose MRSA infection  nor to guide or monitor treatment for MRSA infections. Test performance is not FDA approved in patients less  than 33 years old. Performed at [facility], [address] [place], [place] [address]   Culture, blood (single) w Reflex to ID Panel     Status: None   Collection Time: 09/13/21  4:20 AM   Specimen: BLOOD  Result Value Ref Range Status   Specimen Description BLOOD LEFT [facility]  Final   Special Requests   Final    BOTTLES DRAWN AEROBIC AND ANAEROBIC Blood Culture adequate volume   Culture   Final    NO GROWTH 5 DAYS Performed at [facility], [address], [place], [place] [address]    Report Status 09/18/2021 FINAL  Final     Time coordinating discharge: Over 30 minutes  SIGNED:   Wyvonnia Dusky, MD  [facility] 09/18/2021, 1:00 PM Pager   If 7PM-7AM, please contact night-coverage

## 2021-09-18 NOTE — Progress Notes (Signed)
Discharge instructions explained to pt and husband./both verbalized understanding. IV and tele removed. Will transport off unit via wheelchair when ride arrives.

## 2021-09-22 ENCOUNTER — Telehealth: Payer: Self-pay | Admitting: Family

## 2021-09-22 ENCOUNTER — Ambulatory Visit: Payer: Medicare HMO | Admitting: Family

## 2021-09-22 NOTE — Telephone Encounter (Signed)
Patient did not show for her [facility] appointment on 09/22/21. Will attempt to reschedule.

## 2022-03-26 DEATH — deceased

## 2022-12-25 IMAGING — DX DG CHEST 1V PORT
1 series · 1 of 1 positions shown · non-contrast
Comparison: 09/12/2021

CLINICAL DATA: Acute on chronic respiratory distress.

EXAM:
PORTABLE CHEST 1 VIEW

[chest ap]
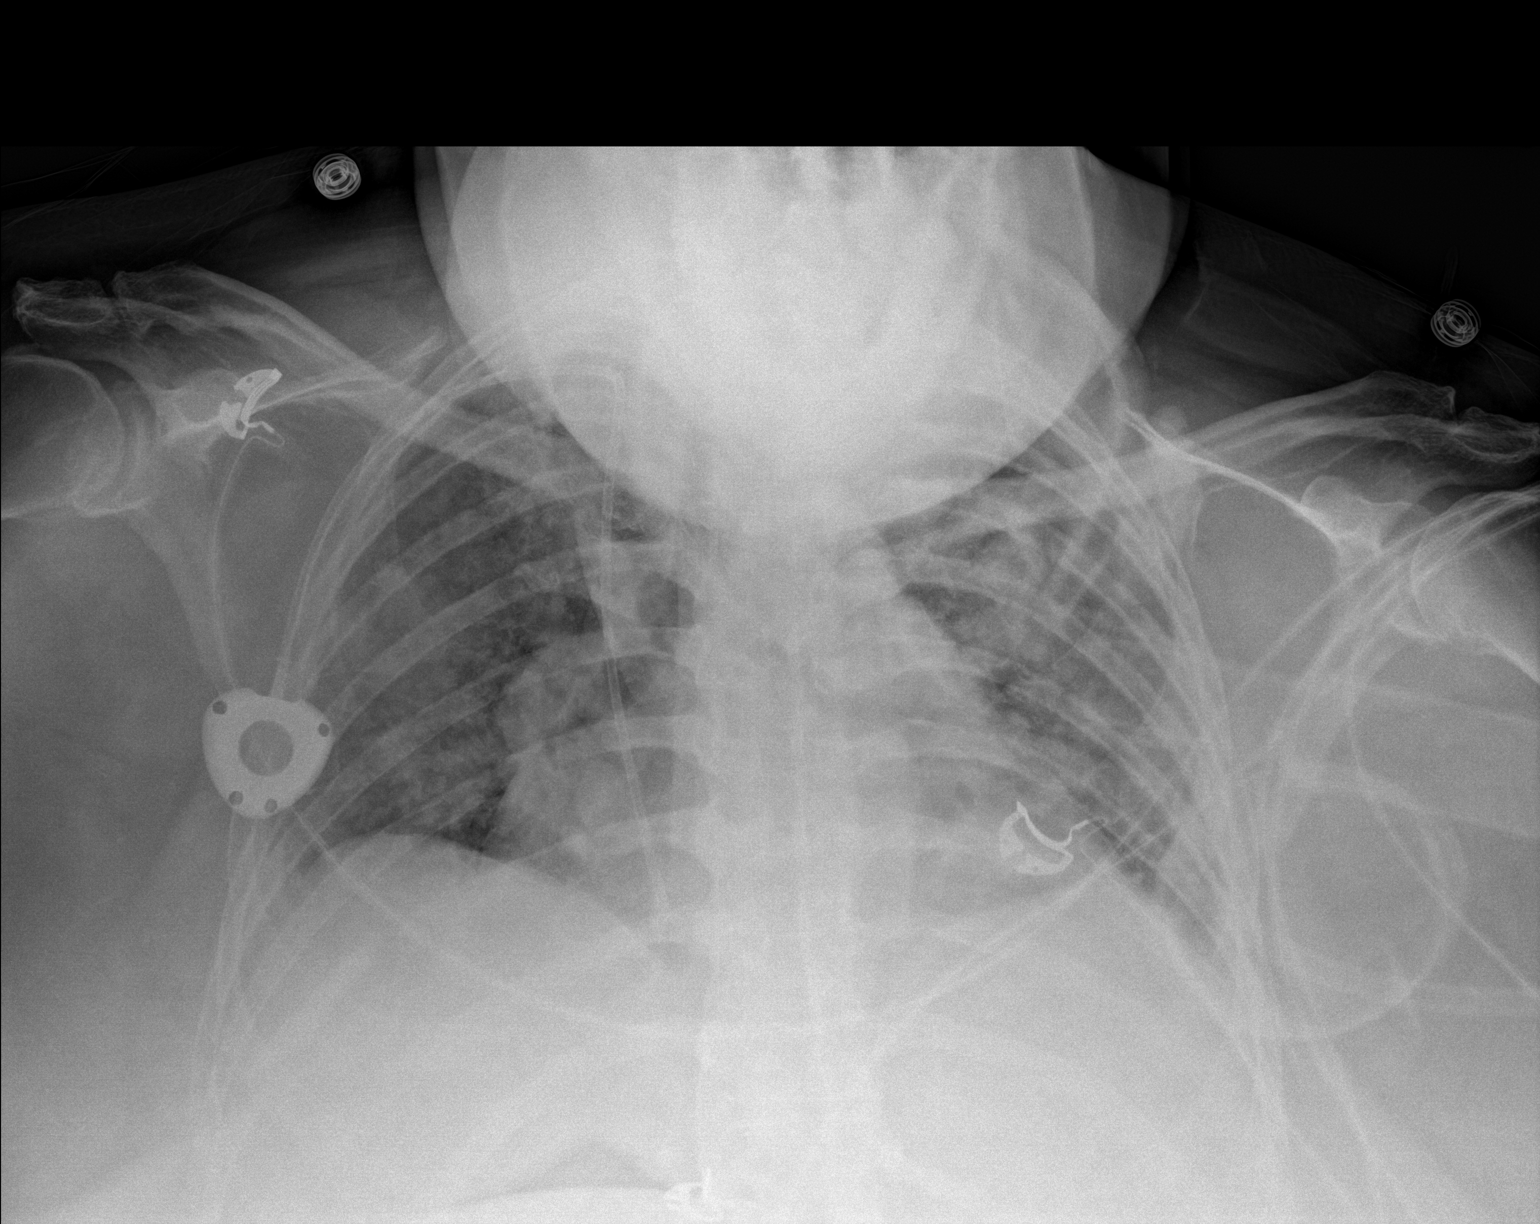

[1 of 1 positions shown; findings below may reference images not displayed]

FINDINGS: Shallow inspiration and kyphosis limit evaluation. Cardiac
enlargement with perihilar infiltration or edema. No pleural
effusions. No apparent pneumothorax. Right central venous catheter
with tip over the SVC. No significant change since prior study.
IMPRESSION: Technically limited study. Cardiac enlargement with perihilar
infiltration similar to previous study.
